# Patient Record
Sex: Male | Born: 1970 | Race: White | Hispanic: No | Marital: Married | Smoking: Former smoker
Health system: Southern US, Community
[De-identification: ages and names within clinical notes are randomized; demographics above are authoritative.]

## PROBLEM LIST (undated history)

## (undated) DIAGNOSIS — F3289 Other specified depressive episodes: Secondary | ICD-10-CM

## (undated) DIAGNOSIS — Z21 Asymptomatic human immunodeficiency virus [HIV] infection status: Secondary | ICD-10-CM

## (undated) DIAGNOSIS — R079 Chest pain, unspecified: Secondary | ICD-10-CM

## (undated) DIAGNOSIS — G4700 Insomnia, unspecified: Secondary | ICD-10-CM

## (undated) DIAGNOSIS — C44519 Basal cell carcinoma of skin of other part of trunk: Secondary | ICD-10-CM

## (undated) DIAGNOSIS — F329 Major depressive disorder, single episode, unspecified: Secondary | ICD-10-CM

## (undated) DIAGNOSIS — B009 Herpesviral infection, unspecified: Secondary | ICD-10-CM

## (undated) DIAGNOSIS — B20 Human immunodeficiency virus [HIV] disease: Secondary | ICD-10-CM

## (undated) DIAGNOSIS — R369 Urethral discharge, unspecified: Secondary | ICD-10-CM

## (undated) DIAGNOSIS — S82892A Other fracture of left lower leg, initial encounter for closed fracture: Secondary | ICD-10-CM

## (undated) DIAGNOSIS — F419 Anxiety disorder, unspecified: Secondary | ICD-10-CM

## (undated) HISTORY — DX: Chest pain, unspecified: R07.9

## (undated) HISTORY — DX: Human immunodeficiency virus (HIV) disease: B20

## (undated) HISTORY — DX: Insomnia, unspecified: G47.00

## (undated) HISTORY — DX: Urethral discharge, unspecified: R36.9

## (undated) HISTORY — DX: Herpesviral infection, unspecified: B00.9

## (undated) HISTORY — DX: Anxiety disorder, unspecified: F41.9

## (undated) HISTORY — DX: Basal cell carcinoma of skin of other part of trunk: C44.519

## (undated) HISTORY — DX: Other specified depressive episodes: F32.89

## (undated) HISTORY — DX: Major depressive disorder, single episode, unspecified: F32.9

## (undated) HISTORY — DX: Asymptomatic human immunodeficiency virus (hiv) infection status: Z21

## (undated) HISTORY — DX: Other fracture of left lower leg, initial encounter for closed fracture: S82.892A

---

## 2009-02-08 LAB — CONVERTED CEMR LAB: HIV 1 RNA Quant: <50 copies/mL

## 2009-06-13 LAB — CONVERTED CEMR LAB
CD4 Count: 514 microliters
HIV 1 RNA Quant: <50 copies/mL
Hemoglobin: 15 g/dL
WBC: 7.4 10*3/uL

## 2010-09-17 ENCOUNTER — Ambulatory Visit: Payer: Self-pay | Admitting: Adult Health

## 2010-09-17 DIAGNOSIS — G4700 Insomnia, unspecified: Secondary | ICD-10-CM | POA: Insufficient documentation

## 2010-09-17 DIAGNOSIS — R369 Urethral discharge, unspecified: Secondary | ICD-10-CM | POA: Insufficient documentation

## 2010-09-17 DIAGNOSIS — B20 Human immunodeficiency virus [HIV] disease: Secondary | ICD-10-CM | POA: Insufficient documentation

## 2010-09-17 DIAGNOSIS — F32A Depression, unspecified: Secondary | ICD-10-CM | POA: Insufficient documentation

## 2010-09-17 DIAGNOSIS — F329 Major depressive disorder, single episode, unspecified: Secondary | ICD-10-CM

## 2010-09-17 DIAGNOSIS — B009 Herpesviral infection, unspecified: Secondary | ICD-10-CM | POA: Insufficient documentation

## 2010-09-17 LAB — CONVERTED CEMR LAB
Albumin: 5.3 g/dL — ABNORMAL HIGH (ref 3.5–5.2)
Alkaline Phosphatase: 52 units/L (ref 39–117)
BUN: 10 mg/dL (ref 6–23)
Basophils Relative: 0 % (ref 0–1)
CO2: 28 meq/L (ref 19–32)
Calcium: 9.6 mg/dL (ref 8.4–10.5)
Chloride: 102 meq/L (ref 96–112)
Glucose, Bld: 98 mg/dL (ref 70–99)
HCV Ab: NEGATIVE
HDL: 41 mg/dL (ref >39)
HIV-2 Ab: NEGATIVE
Hep A Total Ab: POSITIVE — AB
LDL Cholesterol: 114 mg/dL — ABNORMAL HIGH (ref 0–99)
Lymphocytes Relative: 35 % (ref 12–46)
MCHC: 34.1 g/dL (ref 30.0–36.0)
Monocytes Relative: 6 % (ref 3–12)
Neutro Abs: 2.9 10*3/uL (ref 1.7–7.7)
Neutrophils Relative %: 57 % (ref 43–77)
Potassium: 4.4 meq/L (ref 3.5–5.3)
RBC: 4.42 M/uL (ref 4.22–5.81)
Sodium: 140 meq/L (ref 135–145)
Total CHOL/HDL Ratio: 4.6
Total Protein: 7.9 g/dL (ref 6.0–8.3)
Triglycerides: 160 mg/dL — ABNORMAL HIGH (ref <150)
WBC: 5.1 10*3/uL (ref 4.0–10.5)

## 2010-10-02 ENCOUNTER — Ambulatory Visit: Payer: Self-pay | Admitting: Infectious Diseases

## 2010-10-02 DIAGNOSIS — F17200 Nicotine dependence, unspecified, uncomplicated: Secondary | ICD-10-CM | POA: Insufficient documentation

## 2010-11-15 ENCOUNTER — Encounter: Payer: Self-pay | Admitting: Infectious Diseases

## 2010-12-10 NOTE — Miscellaneous (Signed)
Summary: HIPAA Restrictions  HIPAA Restrictions   Imported By: Florinda Marker 10/08/2010 09:24:14  _____________________________________________________________________  External Attachment:    Type:   Image     Comment:   External Document
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
## 2010-12-10 NOTE — Assessment & Plan Note (Signed)
Summary: Nurse Visit (Infectious Disease)   Infectious Disease New Patient Intake Referring MD/Agency: [facility]  Address: [address] [place], [place] [address]  Return Appointment Date: 10/02/2010  With Physician: Johny Sax  Medical Records: Received [facility] / Payor: Medicare    Do you have a Primary physician: No  Medical History Medication Allergies: No   Tobacco Use: never  Behavioral Health Assessment Have you ever been diagnosed with depression or mental illness? Yes  Diagnosis: Depression, Anxiety  Do you drink alcohol? 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 1996 Was this your first time ever being tested or HIV? Yes Risk Factor(s) for HIV: Injecting Drug Use (IDU)  Method of Exposure to HIV: IV Drug Use Heterosexual Intercourse Have you ever been hospitalized for any HIV-related condition? No  Have you ever been under the care of a physician for being HIV positive? Yes Name of Physician 1: [facility].   City/State: [facility] [address]  Newly Diagnosed Patients Has a Disease Intervention Specialist from the Health Department contacted the patient? No.   The patient has been informed that the [facility] Department will contact ALL newly reported cases. Health Department Contact:  [phone]   (SSN is needed for confirmation)  Health Department Contact:  [phone]            (SSN is needed for confirmation)   Infection History  Patient has been diagnosed with the following opportunistic infections: Are there any other symptoms you need to discuss? No Have you received literature/education prior to this visit about HIV/AIDS? Yes Do you understand the meaning of a Viral Load? Yes Do  you understand the meaning of a CD4 count? Yes Lab Values Education/Handout Given Yes Medication Education/Handout Given Yes  Sexual History Are you in a current relationship? No Are you currently sexually active? No Safe Sex Counseling/Pamphlet Given Sexual History Comments: No sexual activity in one year   Immunizations Administered:  PPD Skin Test:    Vaccine Type: PPD    Site: left forearm    Mfr: Sanofi Pasteur    Dose: 0.1 ml    Route: ID    Given by: Tomasita Morrow RN    Exp. Date: 05/16/2012    Lot #: [id number]  Influenza Vaccine # 1:    Vaccine Type: Fluvax MCR    Site: right deltoid    Mfr: Novartis    Dose: 0.5 ml    Route: IM    Given by: Tomasita Morrow RN    Exp. Date: 03/09/2011    Lot #: [id number]    VIS given: 06/04/10 version given September 17, 2010.  Pneumonia Vaccine:    Vaccine Type: Pneumovax    Site: left deltoid    Mfr: Merck    Dose: 0.5 ml    Route: IM    Given by: Tomasita Morrow RN    Exp. Date: 03/11/2012    Lot #: [id number]    VIS given: 10/15/09 version given September 17, 2010.  Orders Added: 1)  T-Chlamydia  Probe, urine [id number] 2)  T-CBC w/Diff [[id number]] 3)  T-CD4SP [facility]) [CD4SP] 4)  T-GC Probe, urine [id number] 5)  T-Comprehensive Metabolic Panel [[id number]] 6)  T-Hepatitis B Surface Antigen [[id number]] 7)  T-Hepatitis B Surface Antibody [[id number]] 8)  T-Hepatitis B Core Antibody [86704-23570] 9)  T-Hepatitis C Antibody [86803-23620] 10)  T-Hepatitis A Antibody [[address]] 11)  T-HIV1 Quant rflx Ultra or Genotype [id number] 12)  T-HIV Ab Confirmatory Test/Western Blot [[id number]] 13)  T-Lipid Profile [80061-22930] 14)  T-RPR (Syphilis) [86592-23940] 15)  T-Urinalysis [81003-65000] 16)  T- GC Chlamydia [29562] 17)  TB Skin Test [86580] 18)  Admin 1st Vaccine [90471] 19)  Influenza Vaccine MCR [00025] 20)  Pneumococcal Vaccine [90732] 21)  Admin of Any Addtl Vaccine [90472]    -  Date:  06/13/2009    CD4:  514    Viral Load: <50    CD4%: 34    Hemoglobin: 15    WBC: 7.4    Creatinine: .99  Date:  02/08/2009    CD4: 689    Viral Load: <50

## 2010-12-10 NOTE — Consult Note (Signed)
Summary: New Pt. Referral:   New Pt. Referral:   Imported By: Florinda Marker 10/09/2010 16:32:23  _____________________________________________________________________  External Attachment:    Type:   Image     Comment:   External Document

## 2010-12-12 NOTE — Miscellaneous (Signed)
Summary: RW Update  Clinical Lists Changes  Observations: Added new observation of RWPARTICIP: Yes (11/15/2010 10:23)

## 2010-12-20 ENCOUNTER — Encounter (INDEPENDENT_AMBULATORY_CARE_PROVIDER_SITE_OTHER): Payer: Self-pay | Admitting: *Deleted

## 2010-12-25 ENCOUNTER — Encounter (INDEPENDENT_AMBULATORY_CARE_PROVIDER_SITE_OTHER): Payer: Self-pay | Admitting: *Deleted

## 2010-12-26 NOTE — Miscellaneous (Signed)
  Clinical Lists Changes  Observations: Added new observation of LATINO/HISP: No (12/20/2010 13:20) Added new observation of RACE: White (12/20/2010 13:20)

## 2011-01-01 NOTE — Miscellaneous (Signed)
Summary: RW update  Clinical Lists Changes  Observations: Added new observation of MARITAL STAT: Married (12/25/2010 15:12)

## 2011-01-27 ENCOUNTER — Other Ambulatory Visit: Payer: Self-pay | Admitting: Infectious Diseases

## 2011-01-27 ENCOUNTER — Other Ambulatory Visit (INDEPENDENT_AMBULATORY_CARE_PROVIDER_SITE_OTHER): Payer: Self-pay

## 2011-01-27 DIAGNOSIS — B20 Human immunodeficiency virus [HIV] disease: Secondary | ICD-10-CM

## 2011-01-28 LAB — T-HELPER CELL (CD4) - (RCID CLINIC ONLY): CD4 % Helper T Cell: 37 % (ref 33–55)

## 2011-01-29 LAB — HIV-1 RNA QUANT-NO REFLEX-BLD
HIV 1 RNA Quant: <20 copies/mL (ref <20)
HIV-1 RNA Quant, Log: <1.3 {Log} (ref <1.30)

## 2011-02-10 ENCOUNTER — Ambulatory Visit: Payer: Self-pay | Admitting: Infectious Diseases

## 2011-02-18 ENCOUNTER — Other Ambulatory Visit: Payer: Self-pay | Admitting: Infectious Diseases

## 2011-02-18 DIAGNOSIS — B20 Human immunodeficiency virus [HIV] disease: Secondary | ICD-10-CM

## 2011-02-18 MED ORDER — DARUNAVIR ETHANOLATE 400 MG PO TABS: 400.0000 mg | ORAL_TABLET | Freq: Every day | ORAL | Status: DC

## 2011-02-18 MED ORDER — ACYCLOVIR 800 MG PO TABS: 800.0000 mg | ORAL_TABLET | Freq: Every day | ORAL | Status: DC

## 2011-02-18 MED ORDER — EMTRICITABINE-TENOFOVIR DF 200-300 MG PO TABS: 1.0000 | ORAL_TABLET | Freq: Every day | ORAL | Status: DC

## 2011-02-18 MED ORDER — RITONAVIR 100 MG PO CAPS: 100.0000 mg | ORAL_CAPSULE | Freq: Every day | ORAL | Status: DC

## 2011-03-11 ENCOUNTER — Other Ambulatory Visit: Payer: Self-pay | Admitting: Licensed Clinical Social Worker

## 2011-03-11 DIAGNOSIS — B20 Human immunodeficiency virus [HIV] disease: Secondary | ICD-10-CM

## 2011-03-11 MED ORDER — RITONAVIR 100 MG PO TABS: 100.0000 mg | ORAL_TABLET | Freq: Every day | ORAL | Status: DC

## 2011-03-12 ENCOUNTER — Ambulatory Visit (INDEPENDENT_AMBULATORY_CARE_PROVIDER_SITE_OTHER): Payer: Self-pay | Admitting: Infectious Diseases

## 2011-03-12 ENCOUNTER — Encounter: Payer: Self-pay | Admitting: Infectious Diseases

## 2011-03-12 DIAGNOSIS — F17200 Nicotine dependence, unspecified, uncomplicated: Secondary | ICD-10-CM

## 2011-03-12 DIAGNOSIS — C44519 Basal cell carcinoma of skin of other part of trunk: Secondary | ICD-10-CM

## 2011-03-12 DIAGNOSIS — B20 Human immunodeficiency virus [HIV] disease: Secondary | ICD-10-CM

## 2011-03-12 NOTE — Assessment & Plan Note (Signed)
Doing well except for med lapse (after his labs so they do not reflect this). Will see him back in 4-5 months. His vax are uptodate. Offered condoms. Hep A Ab+.

## 2011-03-12 NOTE — Progress Notes (Signed)
  Subjective:    Patient ID: Gary Lindsey, male    DOB: January 02, 1971, 40 y.o.   MRN: [id number]  HPI 40 yo M with hx of HIV+ since 1996. States he got "sick" at that time. Had thrush, lymphadenopathy and CD4 42 and VL 3.5 million.  Has been on DRVr/TRV for ~ 88yrs. Was prev on KLT.  CD4 660 and VL <20, Chol and Trig <200 (01-27-11).  No problems, working. No problems with meds. Was off for a while in few days in April.   Review of Systems  Constitutional: Negative for appetite change and unexpected weight change.  Gastrointestinal: Negative for diarrhea and constipation.  Genitourinary: Negative for difficulty urinating.  Psychiatric/Behavioral: Positive for sleep disturbance.  works evenings so makes his sleep irregular.      Objective:   Physical Exam  Constitutional: He appears well-developed and well-nourished.  Eyes: EOM are normal. Pupils are equal, round, and reactive to light.  Neck: Neck supple.  Cardiovascular: Normal rate, regular rhythm and normal heart sounds.   Pulmonary/Chest: Effort normal and breath sounds normal.  Abdominal: Soft. Bowel sounds are normal. He exhibits no distension.  Skin:       Oh his L mid chest, posteriorly there is a ~1 cm pearly, raised, ulcerated area with scab. It has a small amt of d/c when pressed, then bleeds.           Assessment & Plan:

## 2011-03-12 NOTE — Assessment & Plan Note (Signed)
Will have him seen by Derm. Spoke with pt about this, risk for spread (low).

## 2011-03-12 NOTE — Assessment & Plan Note (Signed)
Cont  To be smoke free!

## 2011-04-23 ENCOUNTER — Other Ambulatory Visit: Payer: Self-pay | Admitting: Licensed Clinical Social Worker

## 2011-04-23 DIAGNOSIS — B20 Human immunodeficiency virus [HIV] disease: Secondary | ICD-10-CM

## 2011-04-23 MED ORDER — RITONAVIR 100 MG PO TABS: 100.0000 mg | ORAL_TABLET | Freq: Every day | ORAL | Status: DC

## 2011-05-13 ENCOUNTER — Other Ambulatory Visit: Payer: Self-pay | Admitting: Licensed Clinical Social Worker

## 2011-05-13 DIAGNOSIS — B20 Human immunodeficiency virus [HIV] disease: Secondary | ICD-10-CM

## 2011-05-13 MED ORDER — DARUNAVIR ETHANOLATE 400 MG PO TABS: 800.0000 mg | ORAL_TABLET | Freq: Every day | ORAL | Status: DC

## 2011-05-15 ENCOUNTER — Other Ambulatory Visit: Payer: Self-pay | Admitting: *Deleted

## 2011-05-15 DIAGNOSIS — B20 Human immunodeficiency virus [HIV] disease: Secondary | ICD-10-CM

## 2011-05-15 MED ORDER — DARUNAVIR ETHANOLATE 400 MG PO TABS: 800.0000 mg | ORAL_TABLET | Freq: Every day | ORAL | Status: DC

## 2011-08-27 ENCOUNTER — Other Ambulatory Visit: Payer: Self-pay | Admitting: Infectious Diseases

## 2011-08-27 ENCOUNTER — Other Ambulatory Visit (INDEPENDENT_AMBULATORY_CARE_PROVIDER_SITE_OTHER): Payer: Medicare Other

## 2011-08-27 ENCOUNTER — Other Ambulatory Visit: Payer: Self-pay

## 2011-08-27 DIAGNOSIS — B002 Herpesviral gingivostomatitis and pharyngotonsillitis: Secondary | ICD-10-CM

## 2011-08-27 DIAGNOSIS — B20 Human immunodeficiency virus [HIV] disease: Secondary | ICD-10-CM

## 2011-08-28 LAB — CBC WITH DIFFERENTIAL/PLATELET
Basophils Absolute: 0 10*3/uL (ref 0.0–0.1)
Eosinophils Relative: 1 % (ref 0–5)
Lymphocytes Relative: 22 % (ref 12–46)
Lymphs Abs: 1.6 10*3/uL (ref 0.7–4.0)
MCV: 97.1 fL (ref 78.0–100.0)
Neutrophils Relative %: 70 % (ref 43–77)
Platelets: 206 10*3/uL (ref 150–400)
RBC: 4.46 MIL/uL (ref 4.22–5.81)
RDW: 13.5 % (ref 11.5–15.5)
WBC: 7.1 10*3/uL (ref 4.0–10.5)

## 2011-08-28 LAB — COMPLETE METABOLIC PANEL WITH GFR
AST: 22 U/L (ref 0–37)
Alkaline Phosphatase: 60 U/L (ref 39–117)
BUN: 12 mg/dL (ref 6–23)
GFR, Est Non African American: >90 mL/min (ref >90)
Glucose, Bld: 79 mg/dL (ref 70–99)
Sodium: 140 mEq/L (ref 135–145)
Total Bilirubin: 0.3 mg/dL (ref 0.3–1.2)

## 2011-08-28 LAB — T-HELPER CELL (CD4) - (RCID CLINIC ONLY)
CD4 % Helper T Cell: 36 % (ref 33–55)
CD4 T Cell Abs: 570 uL (ref 400–2700)

## 2011-09-04 LAB — HIV-1 RNA QUANT-NO REFLEX-BLD: HIV 1 RNA Quant: 47 copies/mL — ABNORMAL HIGH (ref <20)

## 2011-09-10 ENCOUNTER — Encounter: Payer: Self-pay | Admitting: Infectious Diseases

## 2011-09-10 ENCOUNTER — Ambulatory Visit (INDEPENDENT_AMBULATORY_CARE_PROVIDER_SITE_OTHER): Payer: Medicaid Other | Admitting: Infectious Diseases

## 2011-09-10 DIAGNOSIS — Z113 Encounter for screening for infections with a predominantly sexual mode of transmission: Secondary | ICD-10-CM

## 2011-09-10 DIAGNOSIS — Z79899 Other long term (current) drug therapy: Secondary | ICD-10-CM

## 2011-09-10 DIAGNOSIS — Z23 Encounter for immunization: Secondary | ICD-10-CM

## 2011-09-10 DIAGNOSIS — C44519 Basal cell carcinoma of skin of other part of trunk: Secondary | ICD-10-CM

## 2011-09-10 DIAGNOSIS — B20 Human immunodeficiency virus [HIV] disease: Secondary | ICD-10-CM

## 2011-09-10 NOTE — Assessment & Plan Note (Addendum)
He is doing very well, gets flu shot today. Will cont his current rx's. Offered condoms. Will see him back in 6 months with labs prior.

## 2011-09-10 NOTE — Progress Notes (Signed)
  Subjective:    Patient ID: Darrow Barreiro, male    DOB: 05-06-1971, 40 y.o.   MRN: [id number]  HPI 40 yo M with hx of HIV+ since 1996. States he got "sick" at that time. Had thrush, lymphadenopathy and CD4 42 and VL 3.5 million.  Has been on DRVr/TRV for ~ 40yrs. Was prev on KLT.  CD4 570 and VL 47 (08-27-11).  Feels well, going to school. Sleeping better, working less.     Review of Systems  Constitutional: Negative for appetite change and unexpected weight change.  Gastrointestinal: Negative for diarrhea and constipation.  Genitourinary: Negative for dysuria.       Objective:   Physical Exam  Constitutional: He appears well-developed and well-nourished.  Eyes: EOM are normal. Pupils are equal, round, and reactive to light.  Neck: Neck supple.  Cardiovascular: Normal rate, regular rhythm and normal heart sounds.   Pulmonary/Chest: Effort normal and breath sounds normal.  Abdominal: Soft. Bowel sounds are normal. There is no tenderness.  Lymphadenopathy:    He has no cervical adenopathy.  Skin:             Assessment & Plan:

## 2011-09-10 NOTE — Progress Notes (Signed)
Addended by: Wendall Mola A on: 09/10/2011 03:48 PM   Modules accepted: Orders

## 2011-09-10 NOTE — Assessment & Plan Note (Signed)
Will send him to derm, states he has insurance now.

## 2011-09-17 ENCOUNTER — Other Ambulatory Visit: Payer: Self-pay | Admitting: *Deleted

## 2011-09-17 DIAGNOSIS — B20 Human immunodeficiency virus [HIV] disease: Secondary | ICD-10-CM

## 2011-09-17 MED ORDER — ACYCLOVIR 800 MG PO TABS: 800.0000 mg | ORAL_TABLET | Freq: Every day | ORAL | Status: DC

## 2011-09-17 MED ORDER — EMTRICITABINE-TENOFOVIR DF 200-300 MG PO TABS: 1.0000 | ORAL_TABLET | Freq: Every day | ORAL | Status: DC

## 2011-10-16 ENCOUNTER — Other Ambulatory Visit: Payer: Self-pay | Admitting: *Deleted

## 2011-10-16 DIAGNOSIS — B20 Human immunodeficiency virus [HIV] disease: Secondary | ICD-10-CM

## 2011-10-16 MED ORDER — RITONAVIR 100 MG PO TABS: 100.0000 mg | ORAL_TABLET | Freq: Every day | ORAL | Status: DC

## 2011-10-17 ENCOUNTER — Other Ambulatory Visit: Payer: Self-pay | Admitting: *Deleted

## 2011-10-28 ENCOUNTER — Telehealth: Payer: Self-pay | Admitting: *Deleted

## 2011-10-28 NOTE — Telephone Encounter (Signed)
Was unable to make referral to dermatology due to patient having [place] and referral needing to come from PCP.  He was assigned a PCP but never actually saw anyone.  Spoke with patient and he did schedule an appointment with a PCP and got his Medicaid changed to that provider. Called patient and left him a message to call back and let me know if he was ever able to be seen by a dermatologist. Wendall Mola CMA

## 2011-11-25 ENCOUNTER — Other Ambulatory Visit: Payer: Self-pay | Admitting: *Deleted

## 2011-11-25 DIAGNOSIS — B20 Human immunodeficiency virus [HIV] disease: Secondary | ICD-10-CM

## 2011-11-25 MED ORDER — EMTRICITABINE-TENOFOVIR DF 200-300 MG PO TABS: 1.0000 | ORAL_TABLET | Freq: Every day | ORAL | Status: DC

## 2011-11-25 MED ORDER — RITONAVIR 100 MG PO CAPS: 100.0000 mg | ORAL_CAPSULE | Freq: Every day | ORAL | Status: DC

## 2011-11-25 MED ORDER — DARUNAVIR ETHANOLATE 400 MG PO TABS: 800.0000 mg | ORAL_TABLET | Freq: Every day | ORAL | Status: DC

## 2011-12-04 ENCOUNTER — Telehealth: Payer: Self-pay | Admitting: *Deleted

## 2011-12-04 NOTE — Telephone Encounter (Signed)
Patient saw Dr. Bruna Potter as new PCP, he did not have a good experience there.  He is going to try [facility] and have his [place] changed to that office.  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  He has thought about going to the ED to see if they will refer him.  He is going to see how quickly he can get in to see a new PCP.  This clinic was unable to refer because of the [place]. Wendall Mola CMA

## 2011-12-11 ENCOUNTER — Telehealth: Payer: Self-pay | Admitting: *Deleted

## 2011-12-11 NOTE — Telephone Encounter (Signed)
error 

## 2011-12-29 ENCOUNTER — Other Ambulatory Visit: Payer: Self-pay | Admitting: *Deleted

## 2011-12-29 MED ORDER — DARUNAVIR ETHANOLATE 800 MG PO TABS: 800.0000 mg | ORAL_TABLET | Freq: Every day | ORAL | Status: DC

## 2011-12-30 ENCOUNTER — Other Ambulatory Visit: Payer: Medicaid Other

## 2011-12-30 ENCOUNTER — Telehealth: Payer: Self-pay | Admitting: *Deleted

## 2011-12-30 DIAGNOSIS — Z79899 Other long term (current) drug therapy: Secondary | ICD-10-CM

## 2011-12-30 DIAGNOSIS — Z113 Encounter for screening for infections with a predominantly sexual mode of transmission: Secondary | ICD-10-CM

## 2011-12-30 DIAGNOSIS — B20 Human immunodeficiency virus [HIV] disease: Secondary | ICD-10-CM

## 2011-12-30 NOTE — Telephone Encounter (Signed)
Patient came in for labs and said that he was seen at dermatology and the mole on his back was removed. The cancer was localized and no further treatment is needed. Wendall Mola CMA

## 2011-12-31 LAB — COMPREHENSIVE METABOLIC PANEL
Alkaline Phosphatase: 73 U/L (ref 39–117)
BUN: 10 mg/dL (ref 6–23)
Creat: 0.91 mg/dL (ref 0.50–1.35)
Glucose, Bld: 84 mg/dL (ref 70–99)
Sodium: 140 mEq/L (ref 135–145)
Total Bilirubin: 0.3 mg/dL (ref 0.3–1.2)

## 2011-12-31 LAB — CBC
Hemoglobin: 14.1 g/dL (ref 13.0–17.0)
MCHC: 32.5 g/dL (ref 30.0–36.0)
RBC: 4.4 MIL/uL (ref 4.22–5.81)
WBC: 6 10*3/uL (ref 4.0–10.5)

## 2011-12-31 LAB — LIPID PANEL
HDL: 36 mg/dL — ABNORMAL LOW (ref >39)
LDL Cholesterol: 91 mg/dL (ref 0–99)
Total CHOL/HDL Ratio: 4.6 Ratio
Triglycerides: 193 mg/dL — ABNORMAL HIGH (ref <150)
VLDL: 39 mg/dL (ref 0–40)

## 2012-01-01 LAB — HIV-1 RNA QUANT-NO REFLEX-BLD
HIV 1 RNA Quant: <20 copies/mL (ref <20)
HIV-1 RNA Quant, Log: <1.3 {Log} (ref <1.30)

## 2012-01-13 ENCOUNTER — Ambulatory Visit: Payer: Medicaid Other | Admitting: Infectious Diseases

## 2012-01-13 ENCOUNTER — Telehealth: Payer: Self-pay | Admitting: *Deleted

## 2012-01-13 NOTE — Telephone Encounter (Signed)
Called patient and got his voice mail box. Left a message to call and reschedule his missed appointment as soon as he can.

## 2012-02-21 ENCOUNTER — Other Ambulatory Visit: Payer: Self-pay | Admitting: Infectious Diseases

## 2012-04-03 ENCOUNTER — Other Ambulatory Visit: Payer: Self-pay | Admitting: Infectious Diseases

## 2012-04-30 ENCOUNTER — Other Ambulatory Visit: Payer: Self-pay | Admitting: Infectious Diseases

## 2012-05-03 ENCOUNTER — Other Ambulatory Visit (HOSPITAL_COMMUNITY)
Admission: RE | Admit: 2012-05-03 | Discharge: 2012-05-03 | Disposition: A | Discharge status: Home / Self Care | Payer: Medicare Other | Source: Ambulatory Visit | Attending: Infectious Diseases | Admitting: Infectious Diseases

## 2012-05-03 ENCOUNTER — Other Ambulatory Visit: Payer: Self-pay | Admitting: *Deleted

## 2012-05-03 ENCOUNTER — Other Ambulatory Visit: Payer: Medicare Other

## 2012-05-03 DIAGNOSIS — Z113 Encounter for screening for infections with a predominantly sexual mode of transmission: Secondary | ICD-10-CM | POA: Insufficient documentation

## 2012-05-03 DIAGNOSIS — B20 Human immunodeficiency virus [HIV] disease: Secondary | ICD-10-CM

## 2012-05-03 LAB — CBC WITH DIFFERENTIAL/PLATELET
Eosinophils Absolute: 0.1 10*3/uL (ref 0.0–0.7)
Eosinophils Relative: 1 % (ref 0–5)
Lymphs Abs: 1.4 10*3/uL (ref 0.7–4.0)
MCH: 32.1 pg (ref 26.0–34.0)
MCV: 94.7 fL (ref 78.0–100.0)
Platelets: 250 10*3/uL (ref 150–400)
RBC: 4.52 MIL/uL (ref 4.22–5.81)

## 2012-05-03 LAB — COMPLETE METABOLIC PANEL WITH GFR
BUN: 16 mg/dL (ref 6–23)
CO2: 31 mEq/L (ref 19–32)
Creat: 1.11 mg/dL (ref 0.50–1.35)
GFR, Est African American: >89 mL/min
GFR, Est Non African American: 83 mL/min
Glucose, Bld: 81 mg/dL (ref 70–99)
Total Bilirubin: 0.5 mg/dL (ref 0.3–1.2)

## 2012-05-04 LAB — T-HELPER CELL (CD4) - (RCID CLINIC ONLY)
CD4 % Helper T Cell: 36 % (ref 33–55)
CD4 T Cell Abs: 570 uL (ref 400–2700)

## 2012-05-11 ENCOUNTER — Telehealth: Payer: Self-pay | Admitting: *Deleted

## 2012-05-11 NOTE — Telephone Encounter (Signed)
Patient coming for appt.

## 2012-05-17 ENCOUNTER — Encounter: Payer: Self-pay | Admitting: Infectious Diseases

## 2012-05-17 ENCOUNTER — Ambulatory Visit (INDEPENDENT_AMBULATORY_CARE_PROVIDER_SITE_OTHER): Payer: Medicare Other | Admitting: Infectious Diseases

## 2012-05-17 VITALS — BP 128/84 | HR 65 | Temp 97.8°F | Ht 68.0 in | Wt 178.0 lb

## 2012-05-17 DIAGNOSIS — F411 Generalized anxiety disorder: Secondary | ICD-10-CM

## 2012-05-17 DIAGNOSIS — F419 Anxiety disorder, unspecified: Secondary | ICD-10-CM

## 2012-05-17 DIAGNOSIS — B20 Human immunodeficiency virus [HIV] disease: Secondary | ICD-10-CM

## 2012-05-17 DIAGNOSIS — R079 Chest pain, unspecified: Secondary | ICD-10-CM

## 2012-05-17 NOTE — Assessment & Plan Note (Signed)
Does not have with exertion.  He does not have sx consistent with cardiac origin.  Will schedule GXT to be thorough.

## 2012-05-17 NOTE — Assessment & Plan Note (Signed)
Offered to start him on SSRI (doesn't want, benzodiazepine (afraid he will get addicted), get him in to see counselor (doesn't want). I asked him to call us if there is anything that he needs. He is considering counseling. This is most likely cause of his CP.

## 2012-05-17 NOTE — Assessment & Plan Note (Signed)
He is doing well. Will see him back in 4 months for f/u with labs prior.
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
## 2012-05-30 ENCOUNTER — Other Ambulatory Visit: Payer: Self-pay | Admitting: Infectious Diseases

## 2012-06-09 ENCOUNTER — Encounter: Payer: Self-pay | Admitting: *Deleted

## 2012-06-10 ENCOUNTER — Ambulatory Visit: Payer: Medicare Other | Admitting: Cardiovascular Disease

## 2012-06-28 ENCOUNTER — Other Ambulatory Visit: Payer: Self-pay | Admitting: Infectious Diseases

## 2012-07-01 ENCOUNTER — Telehealth: Payer: Self-pay | Admitting: *Deleted

## 2012-07-01 NOTE — Telephone Encounter (Signed)
Patient called c/o mouth abcess.  He had been set up with a PCP and saw Dr. Bruna Potter one time, but he had a bad experience and has not gone back.  Given appt with Dr. Orvan Falconer, but he needs to establish with another PCP.  He just recently mailed in the form to be put on the Dental list. Wendall Mola CMA

## 2012-07-05 ENCOUNTER — Ambulatory Visit: Payer: Medicare Other | Admitting: Internal Medicine

## 2012-07-07 ENCOUNTER — Telehealth: Payer: Self-pay | Admitting: Infectious Diseases

## 2012-07-07 NOTE — Telephone Encounter (Signed)
Called pt- he is still having CP. He did not make his appt for GXT. He will reschedule. Pain is present on L, at rest, no pain when he is active.  Was having pain in mouth recently from probable dental infection. Took neighbors amoxil recently and feels better.   Encouraged pt get GXT.

## 2012-08-30 ENCOUNTER — Other Ambulatory Visit: Payer: Self-pay | Admitting: Infectious Diseases

## 2012-09-23 ENCOUNTER — Other Ambulatory Visit: Payer: Self-pay | Admitting: Infectious Diseases

## 2012-09-24 ENCOUNTER — Other Ambulatory Visit: Payer: Self-pay | Admitting: *Deleted

## 2012-09-24 DIAGNOSIS — B20 Human immunodeficiency virus [HIV] disease: Secondary | ICD-10-CM

## 2012-09-24 MED ORDER — EMTRICITABINE-TENOFOVIR DF 200-300 MG PO TABS: 1.0000 | ORAL_TABLET | Freq: Every day | ORAL | Status: DC

## 2012-09-24 MED ORDER — DARUNAVIR ETHANOLATE 800 MG PO TABS: 800.0000 mg | ORAL_TABLET | Freq: Every day | ORAL | Status: DC

## 2012-09-24 MED ORDER — RITONAVIR 100 MG PO TABS: 100.0000 mg | ORAL_TABLET | Freq: Every day | ORAL | Status: DC

## 2012-09-27 ENCOUNTER — Other Ambulatory Visit: Payer: Medicare Other

## 2012-09-27 DIAGNOSIS — B20 Human immunodeficiency virus [HIV] disease: Secondary | ICD-10-CM

## 2012-09-27 LAB — CBC
HCT: 43.5 % (ref 39.0–52.0)
Hemoglobin: 14.6 g/dL (ref 13.0–17.0)
MCH: 31.5 pg (ref 26.0–34.0)
MCHC: 33.6 g/dL (ref 30.0–36.0)
MCV: 93.8 fL (ref 78.0–100.0)

## 2012-09-27 LAB — COMPLETE METABOLIC PANEL WITH GFR
Albumin: 4.6 g/dL (ref 3.5–5.2)
Alkaline Phosphatase: 58 U/L (ref 39–117)
BUN: 11 mg/dL (ref 6–23)
GFR, Est Non African American: >89 mL/min
Glucose, Bld: 107 mg/dL — ABNORMAL HIGH (ref 70–99)
Potassium: 4.2 mEq/L (ref 3.5–5.3)
Total Bilirubin: 0.5 mg/dL (ref 0.3–1.2)

## 2012-09-28 LAB — T-HELPER CELL (CD4) - (RCID CLINIC ONLY): CD4 T Cell Abs: 650 uL (ref 400–2700)

## 2012-10-21 ENCOUNTER — Other Ambulatory Visit: Payer: Self-pay | Admitting: Infectious Diseases

## 2012-10-22 ENCOUNTER — Other Ambulatory Visit: Payer: Self-pay | Admitting: Infectious Diseases

## 2012-10-22 DIAGNOSIS — B20 Human immunodeficiency virus [HIV] disease: Secondary | ICD-10-CM

## 2012-10-25 ENCOUNTER — Ambulatory Visit: Payer: Medicare Other | Admitting: Infectious Diseases

## 2012-10-25 ENCOUNTER — Telehealth: Payer: Self-pay | Admitting: *Deleted

## 2012-10-25 NOTE — Telephone Encounter (Signed)
Generic voicemail left reminding patient that he missed today's appointment and asking him to call to reschedule at his earliest convenience. Andree Coss, RN

## 2012-10-28 ENCOUNTER — Other Ambulatory Visit (HOSPITAL_COMMUNITY)
Admission: RE | Admit: 2012-10-28 | Discharge: 2012-10-28 | Disposition: A | Discharge status: Home / Self Care | Payer: Medicare Other | Source: Ambulatory Visit | Attending: Infectious Diseases | Admitting: Infectious Diseases

## 2012-10-28 ENCOUNTER — Encounter: Payer: Self-pay | Admitting: Infectious Diseases

## 2012-10-28 ENCOUNTER — Ambulatory Visit (INDEPENDENT_AMBULATORY_CARE_PROVIDER_SITE_OTHER): Payer: Medicare Other | Admitting: Infectious Diseases

## 2012-10-28 VITALS — BP 135/82 | HR 69 | Temp 98.3°F | Ht 68.0 in | Wt 183.8 lb

## 2012-10-28 DIAGNOSIS — Z113 Encounter for screening for infections with a predominantly sexual mode of transmission: Secondary | ICD-10-CM

## 2012-10-28 DIAGNOSIS — Z79899 Other long term (current) drug therapy: Secondary | ICD-10-CM

## 2012-10-28 DIAGNOSIS — F329 Major depressive disorder, single episode, unspecified: Secondary | ICD-10-CM

## 2012-10-28 DIAGNOSIS — F3289 Other specified depressive episodes: Secondary | ICD-10-CM

## 2012-10-28 DIAGNOSIS — B078 Other viral warts: Secondary | ICD-10-CM | POA: Insufficient documentation

## 2012-10-28 DIAGNOSIS — A630 Anogenital (venereal) warts: Secondary | ICD-10-CM

## 2012-10-28 DIAGNOSIS — B20 Human immunodeficiency virus [HIV] disease: Secondary | ICD-10-CM

## 2012-10-28 DIAGNOSIS — Z23 Encounter for immunization: Secondary | ICD-10-CM

## 2012-10-28 NOTE — Assessment & Plan Note (Signed)
Few seen on exam. States he has had for years after relationship with F with vaginal warts. Anal pap sent.
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
## 2012-10-28 NOTE — Assessment & Plan Note (Signed)
He is doing well. Need to talk to him about partner status/testing. He is offered condoms, refuses. vax are up to date. Will see him back in 6 months.

## 2012-10-28 NOTE — Assessment & Plan Note (Signed)
His mood is much improved. Will continue to watch, he has had f/u with Bernette Redbird.

## 2012-11-17 ENCOUNTER — Other Ambulatory Visit: Payer: Self-pay | Admitting: Infectious Diseases

## 2013-02-03 ENCOUNTER — Other Ambulatory Visit: Payer: Self-pay | Admitting: Infectious Diseases

## 2013-03-07 ENCOUNTER — Other Ambulatory Visit: Payer: Self-pay | Admitting: *Deleted

## 2013-03-07 DIAGNOSIS — B009 Herpesviral infection, unspecified: Secondary | ICD-10-CM

## 2013-03-07 MED ORDER — ACYCLOVIR 800 MG PO TABS: ORAL_TABLET | ORAL | Status: DC

## 2013-04-25 ENCOUNTER — Other Ambulatory Visit: Payer: Medicare Other

## 2013-04-25 DIAGNOSIS — Z113 Encounter for screening for infections with a predominantly sexual mode of transmission: Secondary | ICD-10-CM

## 2013-04-25 DIAGNOSIS — Z79899 Other long term (current) drug therapy: Secondary | ICD-10-CM

## 2013-04-25 DIAGNOSIS — B20 Human immunodeficiency virus [HIV] disease: Secondary | ICD-10-CM

## 2013-04-25 LAB — COMPREHENSIVE METABOLIC PANEL
Albumin: 4.5 g/dL (ref 3.5–5.2)
BUN: 19 mg/dL (ref 6–23)
Calcium: 9.5 mg/dL (ref 8.4–10.5)
Chloride: 104 mEq/L (ref 96–112)
Creat: 1.28 mg/dL (ref 0.50–1.35)
Glucose, Bld: 107 mg/dL — ABNORMAL HIGH (ref 70–99)
Potassium: 5.1 mEq/L (ref 3.5–5.3)

## 2013-04-25 LAB — LIPID PANEL
Cholesterol: 163 mg/dL (ref 0–200)
HDL: 36 mg/dL — ABNORMAL LOW (ref >39)
Total CHOL/HDL Ratio: 4.5 Ratio
Triglycerides: 95 mg/dL (ref <150)

## 2013-04-25 LAB — CBC
MCH: 32.1 pg (ref 26.0–34.0)
MCHC: 34.1 g/dL (ref 30.0–36.0)
MCV: 94.3 fL (ref 78.0–100.0)
Platelets: 219 10*3/uL (ref 150–400)

## 2013-04-26 LAB — HIV-1 RNA QUANT-NO REFLEX-BLD: HIV 1 RNA Quant: <20 copies/mL (ref <20)

## 2013-04-26 LAB — RPR

## 2013-05-09 ENCOUNTER — Ambulatory Visit: Payer: Medicare Other | Admitting: Infectious Diseases

## 2013-05-11 ENCOUNTER — Ambulatory Visit: Payer: Medicare Other | Admitting: Infectious Diseases

## 2013-05-16 ENCOUNTER — Encounter: Payer: Self-pay | Admitting: Infectious Diseases

## 2013-05-16 ENCOUNTER — Ambulatory Visit (INDEPENDENT_AMBULATORY_CARE_PROVIDER_SITE_OTHER): Payer: Medicare Other | Admitting: Infectious Diseases

## 2013-05-16 VITALS — BP 148/82 | HR 73 | Temp 98.0°F | Ht 68.0 in | Wt 185.0 lb

## 2013-05-16 DIAGNOSIS — Z79899 Other long term (current) drug therapy: Secondary | ICD-10-CM

## 2013-05-16 DIAGNOSIS — G4700 Insomnia, unspecified: Secondary | ICD-10-CM

## 2013-05-16 DIAGNOSIS — B20 Human immunodeficiency virus [HIV] disease: Secondary | ICD-10-CM

## 2013-05-16 DIAGNOSIS — Z113 Encounter for screening for infections with a predominantly sexual mode of transmission: Secondary | ICD-10-CM

## 2013-05-16 MED ORDER — TEMAZEPAM 15 MG PO CAPS: 15.0000 mg | ORAL_CAPSULE | Freq: Every evening | ORAL | Status: DC | PRN

## 2013-05-16 NOTE — Progress Notes (Signed)
  Subjective:    Patient ID: Gary Lindsey, male    DOB: 01/04/1971, 42 y.o.   MRN: [id number]  HPI 42 yo M with HIV+, taking DRVr/TRV (prev on KLT/TRV).  Had anal pap for warts 12-13 that was (-).  Has been exercising 5/week. Running 4 miles TID. CP has resolved mostly- has occas. Feels like it is "lung pain". Does not have pain with exertion (lifting wts or with running. Feels more at rest). Still having difficulty with sleep despite this. Difficulty falling asleep. Taking classes at [employer], occas misses due to this. Has quit smoking marijuana as well.   HIV 1 RNA Quant (copies/mL)  Date Value  04/25/2013 <20   09/27/2012 <20   05/03/2012 <20      CD4 T Cell Abs (cmm)  Date Value  04/25/2013 480   09/27/2012 650   05/03/2012 570    Has gotten married.   Review of Systems  Constitutional: Negative for appetite change and unexpected weight change.  Respiratory: Negative for chest tightness and shortness of breath.   Cardiovascular: Positive for chest pain.  Gastrointestinal: Negative for diarrhea and constipation.  Genitourinary: Negative for difficulty urinating.  Psychiatric/Behavioral: Positive for sleep disturbance.       Objective:   Physical Exam  Constitutional: He appears well-developed and well-nourished.  HENT:  Mouth/Throat: No oropharyngeal exudate.  Eyes: EOM are normal. Pupils are equal, round, and reactive to light.  Neck: Neck supple.  Cardiovascular: Normal rate, regular rhythm and normal heart sounds.   Pulmonary/Chest: Effort normal and breath sounds normal.  Abdominal: Soft. Bowel sounds are normal. There is no tenderness.  Lymphadenopathy:    He has no cervical adenopathy.          Assessment & Plan:

## 2013-05-16 NOTE — Assessment & Plan Note (Signed)
Will give him rx for restoril, previously took valium and made him too sleepy.

## 2013-05-16 NOTE — Assessment & Plan Note (Signed)
He is doing very well. He is married and asks about having kids. I suggested that the risk to his wife (and child) of unprotected sex is very low but not zero. They should use condoms until time of ovulation. He asks about PrEP and a rx for truvada is written for his wife (he states that she just had normal labs). He is offered condoms (has plenty already). Will see him back in 6 months.

## 2013-05-17 ENCOUNTER — Telehealth: Payer: Self-pay | Admitting: Licensed Clinical Social Worker

## 2013-05-17 NOTE — Telephone Encounter (Signed)
Patient called stating that he tried  Restoril last night  and it did not help, he stayed up all night and was very groggy this morning. Would like another sleep aid prescription. He states he tried otc sleep aid with no relief.

## 2013-05-18 ENCOUNTER — Other Ambulatory Visit: Payer: Self-pay | Admitting: Infectious Diseases

## 2013-05-18 DIAGNOSIS — G4700 Insomnia, unspecified: Secondary | ICD-10-CM

## 2013-05-18 MED ORDER — ZOLPIDEM TARTRATE 10 MG PO TABS
10.0000 mg | ORAL_TABLET | Freq: Every evening | ORAL | Status: DC | PRN
Start: 2013-05-18 — End: 2014-02-22

## 2013-05-18 NOTE — Telephone Encounter (Signed)
rx sent for [employer], thanks

## 2013-05-18 NOTE — Telephone Encounter (Signed)
Thank you :)

## 2013-05-20 ENCOUNTER — Other Ambulatory Visit: Payer: Self-pay | Admitting: *Deleted

## 2013-05-20 DIAGNOSIS — B009 Herpesviral infection, unspecified: Secondary | ICD-10-CM

## 2013-05-20 MED ORDER — ACYCLOVIR 800 MG PO TABS: ORAL_TABLET | ORAL | Status: DC

## 2013-08-10 ENCOUNTER — Other Ambulatory Visit: Payer: Self-pay | Admitting: Infectious Diseases

## 2013-08-10 DIAGNOSIS — A630 Anogenital (venereal) warts: Secondary | ICD-10-CM

## 2013-09-29 ENCOUNTER — Other Ambulatory Visit: Payer: Self-pay | Admitting: Licensed Clinical Social Worker

## 2013-09-29 DIAGNOSIS — B20 Human immunodeficiency virus [HIV] disease: Secondary | ICD-10-CM

## 2013-09-29 MED ORDER — EMTRICITABINE-TENOFOVIR DF 200-300 MG PO TABS: 1.0000 | ORAL_TABLET | Freq: Every day | ORAL | Status: DC

## 2013-09-29 MED ORDER — DARUNAVIR ETHANOLATE 800 MG PO TABS: 800.0000 mg | ORAL_TABLET | Freq: Every day | ORAL | Status: DC

## 2013-09-29 MED ORDER — RITONAVIR 100 MG PO TABS: 100.0000 mg | ORAL_TABLET | Freq: Every day | ORAL | Status: DC

## 2013-10-28 ENCOUNTER — Ambulatory Visit (INDEPENDENT_AMBULATORY_CARE_PROVIDER_SITE_OTHER): Payer: Medicare Other | Admitting: *Deleted

## 2013-10-28 DIAGNOSIS — Z23 Encounter for immunization: Secondary | ICD-10-CM

## 2013-12-12 ENCOUNTER — Other Ambulatory Visit: Payer: Self-pay | Admitting: Infectious Diseases

## 2014-01-06 ENCOUNTER — Other Ambulatory Visit: Payer: Self-pay | Admitting: Infectious Diseases

## 2014-02-03 ENCOUNTER — Other Ambulatory Visit: Payer: Self-pay | Admitting: Infectious Diseases

## 2014-02-05 ENCOUNTER — Other Ambulatory Visit: Payer: Self-pay | Admitting: Infectious Diseases

## 2014-02-08 ENCOUNTER — Other Ambulatory Visit (INDEPENDENT_AMBULATORY_CARE_PROVIDER_SITE_OTHER): Payer: Medicare Other

## 2014-02-08 DIAGNOSIS — B20 Human immunodeficiency virus [HIV] disease: Secondary | ICD-10-CM

## 2014-02-08 DIAGNOSIS — Z113 Encounter for screening for infections with a predominantly sexual mode of transmission: Secondary | ICD-10-CM

## 2014-02-08 DIAGNOSIS — Z79899 Other long term (current) drug therapy: Secondary | ICD-10-CM

## 2014-02-08 LAB — LIPID PANEL
CHOLESTEROL: 170 mg/dL (ref 0–200)
HDL: 41 mg/dL (ref >39)
LDL Cholesterol: 117 mg/dL — ABNORMAL HIGH (ref 0–99)
Total CHOL/HDL Ratio: 4.1 Ratio
Triglycerides: 59 mg/dL (ref <150)
VLDL: 12 mg/dL (ref 0–40)

## 2014-02-08 LAB — CBC
HCT: 43.9 % (ref 39.0–52.0)
Hemoglobin: 15.1 g/dL (ref 13.0–17.0)
MCH: 32 pg (ref 26.0–34.0)
MCHC: 34.4 g/dL (ref 30.0–36.0)
MCV: 93 fL (ref 78.0–100.0)
PLATELETS: 208 10*3/uL (ref 150–400)
RBC: 4.72 MIL/uL (ref 4.22–5.81)
RDW: 15.2 % (ref 11.5–15.5)
WBC: 5.4 10*3/uL (ref 4.0–10.5)

## 2014-02-08 LAB — COMPLETE METABOLIC PANEL WITH GFR
ALT: 22 U/L (ref 0–53)
AST: 30 U/L (ref 0–37)
Albumin: 4.7 g/dL (ref 3.5–5.2)
Alkaline Phosphatase: 55 U/L (ref 39–117)
BUN: 19 mg/dL (ref 6–23)
CO2: 27 meq/L (ref 19–32)
Calcium: 9.2 mg/dL (ref 8.4–10.5)
Chloride: 102 mEq/L (ref 96–112)
Creat: 1.13 mg/dL (ref 0.50–1.35)
GFR, EST NON AFRICAN AMERICAN: 80 mL/min
GLUCOSE: 107 mg/dL — AB (ref 70–99)
POTASSIUM: 4.4 meq/L (ref 3.5–5.3)
SODIUM: 139 meq/L (ref 135–145)
TOTAL PROTEIN: 7.5 g/dL (ref 6.0–8.3)
Total Bilirubin: 0.9 mg/dL (ref 0.2–1.2)

## 2014-02-09 LAB — RPR

## 2014-02-09 LAB — T-HELPER CELL (CD4) - (RCID CLINIC ONLY)
CD4 % Helper T Cell: 38 % (ref 33–55)
CD4 T Cell Abs: 650 /uL (ref 400–2700)

## 2014-02-09 LAB — HIV-1 RNA QUANT-NO REFLEX-BLD: HIV-1 RNA Quant, Log: <1.3 {Log} (ref <1.30)

## 2014-02-22 ENCOUNTER — Encounter: Payer: Self-pay | Admitting: Infectious Diseases

## 2014-02-22 ENCOUNTER — Ambulatory Visit (INDEPENDENT_AMBULATORY_CARE_PROVIDER_SITE_OTHER): Payer: Medicare Other | Admitting: Infectious Diseases

## 2014-02-22 VITALS — BP 142/83 | HR 73 | Temp 98.3°F | Ht 68.0 in | Wt 188.0 lb

## 2014-02-22 DIAGNOSIS — B20 Human immunodeficiency virus [HIV] disease: Secondary | ICD-10-CM

## 2014-02-22 DIAGNOSIS — G4700 Insomnia, unspecified: Secondary | ICD-10-CM

## 2014-02-22 DIAGNOSIS — A630 Anogenital (venereal) warts: Secondary | ICD-10-CM

## 2014-02-22 MED ORDER — DARUNAVIR ETHANOLATE 800 MG PO TABS: 800.0000 mg | ORAL_TABLET | Freq: Every day | ORAL | Status: DC

## 2014-02-22 MED ORDER — EMTRICITABINE-TENOFOVIR DF 200-300 MG PO TABS: 1.0000 | ORAL_TABLET | Freq: Every day | ORAL | Status: DC

## 2014-02-22 MED ORDER — RITONAVIR 100 MG PO TABS: 100.0000 mg | ORAL_TABLET | Freq: Every day | ORAL | Status: DC

## 2014-02-22 MED ORDER — ACYCLOVIR 800 MG PO TABS: ORAL_TABLET | ORAL | Status: DC

## 2014-02-22 NOTE — Assessment & Plan Note (Addendum)
He is doing very well. He has no concerns about his steroid use (250mg /week, does not share needles, has his own "safe box"), has done previously.  I counseled him about pregnancy planning- they will start trying in fall. His wife will have f/u at [facility] prior, will get HIV tested. I instructed him on rhythm, using condoms except for periods around ovulation. I offered PREP for her.  He will rtc in 6 months, labs prior. Offered/refused condoms.  Hep B immune.

## 2014-02-22 NOTE — Progress Notes (Signed)
   Subjective:    Patient ID: Gary Lindsey, male    DOB: 1971/06/28, 43 y.o.   MRN: [id number]  HPI 43 yo M with HIV+, taking DRVr/TRV (prev on KLT/TRV).  Had anal pap for warts 12-13 that was (-).  Has been working out at gym, taking steroids there.  trying to have baby with wife.  HIV 1 RNA Quant (copies/mL)  Date Value  02/08/2014 <20   04/25/2013 <20   09/27/2012 <20      CD4 T Cell Abs (/uL)  Date Value  02/08/2014 650   04/25/2013 480   09/27/2012 650     Review of Systems  Constitutional: Negative for appetite change and unexpected weight change.  Gastrointestinal: Negative for diarrhea and constipation.  Genitourinary: Negative for difficulty urinating.  Psychiatric/Behavioral: Negative for sleep disturbance and dysphoric mood.   Has occas CP after smoking THC, none for 6 months.      Objective:   Physical Exam  Constitutional: He appears well-developed and well-nourished.  HENT:  Mouth/Throat: No oropharyngeal exudate.  Eyes: EOM are normal. Pupils are equal, round, and reactive to light.  Neck: Neck supple.  Cardiovascular: Normal rate, regular rhythm and normal heart sounds.   Pulmonary/Chest: Effort normal and breath sounds normal.  Abdominal: Soft. Bowel sounds are normal. He exhibits no distension. There is no tenderness.  Lymphadenopathy:    He has no cervical adenopathy.  Psychiatric: He has a normal mood and affect.          Assessment & Plan:

## 2014-02-22 NOTE — Assessment & Plan Note (Signed)
Will f/u at next visit, consider HRA.

## 2014-02-22 NOTE — Assessment & Plan Note (Signed)
Improved while on steroids. Will watch.Marland KitchenMarland Kitchen

## 2014-06-07 ENCOUNTER — Emergency Department (HOSPITAL_COMMUNITY)
Admission: EM | Admit: 2014-06-07 | Discharge: 2014-06-08 | Disposition: A | Discharge status: Home / Self Care | Payer: No Typology Code available for payment source | Attending: Emergency Medicine | Admitting: Emergency Medicine

## 2014-06-07 ENCOUNTER — Encounter (HOSPITAL_COMMUNITY): Payer: Self-pay | Admitting: Emergency Medicine

## 2014-06-07 ENCOUNTER — Emergency Department (HOSPITAL_COMMUNITY): Payer: No Typology Code available for payment source

## 2014-06-07 DIAGNOSIS — S0990XA Unspecified injury of head, initial encounter: Secondary | ICD-10-CM | POA: Insufficient documentation

## 2014-06-07 DIAGNOSIS — S82843A Displaced bimalleolar fracture of unspecified lower leg, initial encounter for closed fracture: Secondary | ICD-10-CM | POA: Insufficient documentation

## 2014-06-07 DIAGNOSIS — S8990XA Unspecified injury of unspecified lower leg, initial encounter: Secondary | ICD-10-CM | POA: Diagnosis present

## 2014-06-07 DIAGNOSIS — S99929A Unspecified injury of unspecified foot, initial encounter: Secondary | ICD-10-CM | POA: Diagnosis present

## 2014-06-07 DIAGNOSIS — Y9389 Activity, other specified: Secondary | ICD-10-CM | POA: Insufficient documentation

## 2014-06-07 DIAGNOSIS — Y92410 Unspecified street and highway as the place of occurrence of the external cause: Secondary | ICD-10-CM | POA: Diagnosis not present

## 2014-06-07 DIAGNOSIS — S82892A Other fracture of left lower leg, initial encounter for closed fracture: Secondary | ICD-10-CM

## 2014-06-07 DIAGNOSIS — S99919A Unspecified injury of unspecified ankle, initial encounter: Secondary | ICD-10-CM

## 2014-06-07 LAB — COMPREHENSIVE METABOLIC PANEL
ALT: 25 U/L (ref 0–53)
AST: 38 U/L — AB (ref 0–37)
Albumin: 3.9 g/dL (ref 3.5–5.2)
Alkaline Phosphatase: 52 U/L (ref 39–117)
Anion gap: 14 (ref 5–15)
BUN: 20 mg/dL (ref 6–23)
CALCIUM: 8.3 mg/dL — AB (ref 8.4–10.5)
CO2: 24 meq/L (ref 19–32)
Chloride: 102 mEq/L (ref 96–112)
Creatinine, Ser: 1.26 mg/dL (ref 0.50–1.35)
GFR calc Af Amer: 80 mL/min — ABNORMAL LOW (ref >90)
GFR calc non Af Amer: 69 mL/min — ABNORMAL LOW (ref >90)
Glucose, Bld: 92 mg/dL (ref 70–99)
Potassium: 4.2 mEq/L (ref 3.7–5.3)
Sodium: 140 mEq/L (ref 137–147)
TOTAL PROTEIN: 7.3 g/dL (ref 6.0–8.3)
Total Bilirubin: 0.2 mg/dL — ABNORMAL LOW (ref 0.3–1.2)

## 2014-06-07 LAB — PROTIME-INR
INR: 0.92 (ref 0.00–1.49)
PROTHROMBIN TIME: 12.4 s (ref 11.6–15.2)

## 2014-06-07 LAB — CBC
HCT: 45.4 % (ref 39.0–52.0)
Hemoglobin: 15.6 g/dL (ref 13.0–17.0)
MCH: 33.3 pg (ref 26.0–34.0)
MCHC: 34.4 g/dL (ref 30.0–36.0)
MCV: 96.8 fL (ref 78.0–100.0)
Platelets: 196 10*3/uL (ref 150–400)
RBC: 4.69 MIL/uL (ref 4.22–5.81)
RDW: 13 % (ref 11.5–15.5)
WBC: 8.5 10*3/uL (ref 4.0–10.5)

## 2014-06-07 LAB — SAMPLE TO BLOOD BANK

## 2014-06-07 LAB — I-STAT CG4 LACTIC ACID, ED: LACTIC ACID, VENOUS: 2.19 mmol/L (ref 0.5–2.2)

## 2014-06-07 LAB — ETHANOL: Alcohol, Ethyl (B): 67 mg/dL — ABNORMAL HIGH (ref 0–11)

## 2014-06-07 LAB — CDS SEROLOGY

## 2014-06-07 MED ORDER — HYDROMORPHONE HCL PF 1 MG/ML IJ SOLN
1.0000 mg | Freq: Once | INTRAMUSCULAR | Status: AC
  Administered 2014-06-07: 1 mg via INTRAVENOUS
  Filled 2014-06-07: qty 1

## 2014-06-07 MED ORDER — PROPOFOL 10 MG/ML IV BOLUS
0.5000 mg/kg | Freq: Once | INTRAVENOUS | Status: AC
  Administered 2014-06-07: 70 mg via INTRAVENOUS

## 2014-06-07 MED ORDER — PROPOFOL 10 MG/ML IV BOLUS
INTRAVENOUS | Status: AC
  Administered 2014-06-07: 70 mg via INTRAVENOUS
  Filled 2014-06-07: qty 20

## 2014-06-07 MED ORDER — SODIUM CHLORIDE 0.9 % IV BOLUS (SEPSIS)
1000.0000 mL | Freq: Once | INTRAVENOUS | Status: AC
  Administered 2014-06-07: 1000 mL via INTRAVENOUS

## 2014-06-07 MED ORDER — PROPOFOL 10 MG/ML IV BOLUS
INTRAVENOUS | Status: AC | PRN
  Administered 2014-06-07: 30 mg via INTRAVENOUS

## 2014-06-07 NOTE — Progress Notes (Signed)
Orthopedic Tech Progress Note Patient Details:  Gary Lindsey [birth year] [id number]  Ortho Devices Type of Ortho Device: Ace wrap;[place] (short leg) splint;Stirrup splint Ortho Device/Splint Location: LLE Ortho Device/Splint Interventions: Ordered;Application   Braulio Bosch 06/07/2014, 10:55 PM
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
## 2014-06-07 NOTE — ED Notes (Signed)
Family at bedside and updated to patient's status

## 2014-06-07 NOTE — ED Notes (Signed)
Pt taken to CT.

## 2014-06-07 NOTE — ED Notes (Signed)
Ortho tech paged for left ankle splint
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
## 2014-06-08 ENCOUNTER — Emergency Department (HOSPITAL_COMMUNITY): Payer: No Typology Code available for payment source

## 2014-06-08 ENCOUNTER — Encounter (HOSPITAL_COMMUNITY): Payer: Self-pay

## 2014-06-08 MED ORDER — HYDROMORPHONE HCL PF 1 MG/ML IJ SOLN
1.0000 mg | Freq: Once | INTRAMUSCULAR | Status: AC
  Administered 2014-06-08: 1 mg via INTRAVENOUS
  Filled 2014-06-08: qty 1

## 2014-06-08 MED ORDER — HYDROCODONE-ACETAMINOPHEN 5-325 MG PO TABS: 2.0000 | ORAL_TABLET | ORAL | Status: DC | PRN

## 2014-06-08 NOTE — ED Notes (Signed)
EDP spoke with ortho surgeon, pt will follow up outside of ED

## 2014-06-08 NOTE — Discharge Instructions (Signed)
Ankle Fracture  A fracture is a break in a bone. The ankle joint is made up of three bones. These include the lower (distal)sections of your lower leg bones, called the tibia and fibula, along with a bone in your foot, called the talus. Depending on how bad the break is and if more than one ankle joint bone is broken, a cast or splint is used to protect and keep your injured bone from moving while it heals. Sometimes, surgery is required to help the fracture heal properly.   There are two general types of fractures:   Stable fracture. This includes a single fracture line through one bone, with no injury to ankle ligaments. A fracture of the talus that does not have any displacement (movement of the bone on either side of the fracture line) is also stable.   Unstable fracture. This includes more than one fracture line through one or more bones in the ankle joint. It also includes fractures that have displacement of the bone on either side of the fracture line.  CAUSES   A direct blow to the ankle.    Quickly and severely twisting your ankle.   Trauma, such as a car accident or falling from a significant height.  RISK FACTORS  You may be at a higher risk of ankle fracture if:   You have certain medical conditions.   You are involved in high-impact sports.   You are involved in a high-impact car accident.  SIGNS AND SYMPTOMS    Tender and swollen ankle.   Bruising around the injured ankle.   Pain on movement of the ankle.   Difficulty walking or putting weight on the ankle.   A cold foot below the site of the ankle injury. This can occur if the blood vessels passing through your injured ankle were also damaged.   Numbness in the foot below the site of the ankle injury.  DIAGNOSIS   An ankle fracture is usually diagnosed with a physical exam and X-rays. A CT scan may also be required for complex fractures.  TREATMENT   Stable fractures are treated with a cast or splint and using crutches to avoid putting  weight on your injured ankle. This is followed by an ankle strengthening program. Some patients require a special type of cast, depending on other medical problems they may have. Unstable fractures require surgery to ensure the bones heal properly. Your health care provider will tell you what type of fracture you have and the best treatment for your condition.  HOME CARE INSTRUCTIONS    Review correct crutch use with your health care provider and use your crutches as directed. Safe use of crutches is extremely important. Misuse of crutches can cause you to fall or cause injury to nerves in your hands or armpits.   Do not put weight or pressure on the injured ankle until directed by your health care provider.   To lessen the swelling, keep the injured leg elevated while sitting or lying down.   Apply ice to the injured area:   Put ice in a plastic bag.   Place a towel between your cast and the bag.   Leave the ice on for 20 minutes, 2-3 times a day.   If you have a plaster or fiberglass cast:   Do not try to scratch the skin under the cast with any objects. This can increase your risk of skin infection.   Check the skin around the cast every day. You   may put lotion on any red or sore areas.   Keep your cast dry and clean.   If you have a plaster splint:   Wear the splint as directed.   You may loosen the elastic around the splint if your toes become numb, tingle, or turn cold or blue.   Do not put pressure on any part of your cast or splint; it may break. Rest your cast only on a pillow the first 24 hours until it is fully hardened.   Your cast or splint can be protected during bathing with a plastic bag sealed to your skin with medical tape. Do not lower the cast or splint into water.   Take medicines as directed by your health care provider. Only take over-the-counter or prescription medicines for pain, discomfort, or fever as directed by your health care provider.   Do not drive a vehicle until  your health care provider specifically tells you it is safe to do so.   If your health care provider has given you a follow-up appointment, it is very important to keep that appointment. Not keeping the appointment could result in a chronic or permanent injury, pain, and disability. If you have any problem keeping the appointment, call the facility for assistance.  SEEK MEDICAL CARE IF:  You develop increased swelling or discomfort.  SEEK IMMEDIATE MEDICAL CARE IF:    Your cast gets damaged or breaks.   You have continued severe pain.   You develop new pain or swelling after the cast was put on.   Your skin or toenails below the injury turn blue or gray.   Your skin or toenails below the injury feel cold, numb, or have loss of sensitivity to touch.   There is a bad smell or pus draining from under the cast.  MAKE SURE YOU:    Understand these instructions.   Will watch your condition.   Will get help right away if you are not doing well or get worse.  Document Released: 10/24/2000 Document Revised: 11/01/2013 Document Reviewed: 05/26/2013  Exit[employer] Patient Information 2015 [employer]. This information is not intended to replace advice given to you by your health care provider. Make sure you discuss any questions you have with your health care provider.

## 2014-06-09 ENCOUNTER — Telehealth: Payer: Self-pay | Admitting: *Deleted

## 2014-06-09 NOTE — Telephone Encounter (Signed)
Pt having Orthopedic Surgery Mon., Aug 3rd.  Requesting last OV note and labs prior to surgery.
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
## 2014-06-12 DIAGNOSIS — S82892A Other fracture of left lower leg, initial encounter for closed fracture: Secondary | ICD-10-CM | POA: Insufficient documentation

## 2014-06-12 HISTORY — DX: Other fracture of left lower leg, initial encounter for closed fracture: S82.892A

## 2014-07-02 ENCOUNTER — Other Ambulatory Visit: Payer: Self-pay | Admitting: Infectious Diseases

## 2014-07-21 ENCOUNTER — Encounter: Payer: Self-pay | Admitting: Infectious Diseases

## 2014-10-15 ENCOUNTER — Other Ambulatory Visit: Payer: Self-pay | Admitting: Internal Medicine

## 2014-10-15 DIAGNOSIS — B20 Human immunodeficiency virus [HIV] disease: Secondary | ICD-10-CM

## 2014-10-16 ENCOUNTER — Other Ambulatory Visit: Payer: Self-pay | Admitting: Infectious Diseases

## 2014-10-16 DIAGNOSIS — B20 Human immunodeficiency virus [HIV] disease: Secondary | ICD-10-CM

## 2014-10-17 ENCOUNTER — Other Ambulatory Visit: Payer: Medicaid Other

## 2014-10-17 DIAGNOSIS — B20 Human immunodeficiency virus [HIV] disease: Secondary | ICD-10-CM

## 2014-10-17 LAB — CBC WITH DIFFERENTIAL/PLATELET
Basophils Absolute: 0 10*3/uL (ref 0.0–0.1)
Basophils Relative: 0 % (ref 0–1)
EOS ABS: 0 10*3/uL (ref 0.0–0.7)
Eosinophils Relative: 1 % (ref 0–5)
HCT: 41.6 % (ref 39.0–52.0)
HEMOGLOBIN: 14.1 g/dL (ref 13.0–17.0)
LYMPHS ABS: 1.3 10*3/uL (ref 0.7–4.0)
LYMPHS PCT: 26 % (ref 12–46)
MCH: 32 pg (ref 26.0–34.0)
MCHC: 33.9 g/dL (ref 30.0–36.0)
MCV: 94.5 fL (ref 78.0–100.0)
MPV: 11.4 fL (ref 9.4–12.4)
Monocytes Absolute: 0.4 10*3/uL (ref 0.1–1.0)
Monocytes Relative: 9 % (ref 3–12)
NEUTROS PCT: 64 % (ref 43–77)
Neutro Abs: 3.1 10*3/uL (ref 1.7–7.7)
PLATELETS: 219 10*3/uL (ref 150–400)
RBC: 4.4 MIL/uL (ref 4.22–5.81)
RDW: 14.3 % (ref 11.5–15.5)
WBC: 4.9 10*3/uL (ref 4.0–10.5)

## 2014-10-18 LAB — T-HELPER CELL (CD4) - (RCID CLINIC ONLY)
CD4 % Helper T Cell: 38 % (ref 33–55)
CD4 T CELL ABS: 540 /uL (ref 400–2700)

## 2014-10-18 LAB — COMPLETE METABOLIC PANEL WITH GFR
ALT: 17 U/L (ref 0–53)
AST: 20 U/L (ref 0–37)
Albumin: 4.6 g/dL (ref 3.5–5.2)
Alkaline Phosphatase: 51 U/L (ref 39–117)
BUN: 16 mg/dL (ref 6–23)
CALCIUM: 9.3 mg/dL (ref 8.4–10.5)
CHLORIDE: 102 meq/L (ref 96–112)
CO2: 28 meq/L (ref 19–32)
Creat: 0.97 mg/dL (ref 0.50–1.35)
Glucose, Bld: 91 mg/dL (ref 70–99)
Potassium: 4.6 mEq/L (ref 3.5–5.3)
Sodium: 141 mEq/L (ref 135–145)
Total Bilirubin: 0.4 mg/dL (ref 0.2–1.2)
Total Protein: 7.1 g/dL (ref 6.0–8.3)

## 2014-10-18 LAB — HIV-1 RNA QUANT-NO REFLEX-BLD
HIV 1 RNA Quant: <20 copies/mL (ref <20)
HIV-1 RNA Quant, Log: <1.3 {Log} (ref <1.30)

## 2014-11-13 ENCOUNTER — Encounter: Payer: Self-pay | Admitting: Infectious Diseases

## 2014-11-13 ENCOUNTER — Ambulatory Visit (INDEPENDENT_AMBULATORY_CARE_PROVIDER_SITE_OTHER): Payer: Medicaid Other | Admitting: Infectious Diseases

## 2014-11-13 VITALS — BP 128/80 | HR 60 | Temp 98.1°F | Ht 68.0 in | Wt 165.0 lb

## 2014-11-13 DIAGNOSIS — C44519 Basal cell carcinoma of skin of other part of trunk: Secondary | ICD-10-CM

## 2014-11-13 DIAGNOSIS — B20 Human immunodeficiency virus [HIV] disease: Secondary | ICD-10-CM

## 2014-11-13 DIAGNOSIS — Z79899 Other long term (current) drug therapy: Secondary | ICD-10-CM

## 2014-11-13 DIAGNOSIS — B009 Herpesviral infection, unspecified: Secondary | ICD-10-CM

## 2014-11-13 DIAGNOSIS — A630 Anogenital (venereal) warts: Secondary | ICD-10-CM

## 2014-11-13 DIAGNOSIS — Z113 Encounter for screening for infections with a predominantly sexual mode of transmission: Secondary | ICD-10-CM

## 2014-11-13 DIAGNOSIS — Z23 Encounter for immunization: Secondary | ICD-10-CM

## 2014-11-13 NOTE — Assessment & Plan Note (Signed)
Not clear if he has recurrence or if this is condyloma on his back.

## 2014-11-13 NOTE — Assessment & Plan Note (Signed)
No further episodes. Will f/u prn.
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
## 2014-11-13 NOTE — Assessment & Plan Note (Signed)
Has been taking ACV every day, 800mg . Only has oral lesions, no genital.

## 2014-11-13 NOTE — Assessment & Plan Note (Addendum)
He is doing very well. Will continue his current meds. Wife is getting tested. Offered/refused condoms. Flu shot today. Tetanus shot today as well. rtc 6 months.

## 2014-11-17 NOTE — Addendum Note (Signed)
Addended by: Landis Gandy on: 11/17/2014 12:12 PM   Modules accepted: Orders

## 2015-03-22 ENCOUNTER — Other Ambulatory Visit: Payer: Self-pay | Admitting: *Deleted

## 2015-03-22 ENCOUNTER — Other Ambulatory Visit: Payer: Self-pay | Admitting: Infectious Diseases

## 2015-03-22 DIAGNOSIS — B20 Human immunodeficiency virus [HIV] disease: Secondary | ICD-10-CM

## 2015-03-22 MED ORDER — ACYCLOVIR 800 MG PO TABS: 800.0000 mg | ORAL_TABLET | Freq: Every day | ORAL | Status: DC

## 2015-03-22 MED ORDER — RITONAVIR 100 MG PO TABS: 100.0000 mg | ORAL_TABLET | Freq: Every day | ORAL | Status: DC

## 2015-03-22 MED ORDER — DARUNAVIR ETHANOLATE 800 MG PO TABS: 800.0000 mg | ORAL_TABLET | Freq: Every day | ORAL | Status: DC

## 2015-03-22 MED ORDER — EMTRICITABINE-TENOFOVIR DF 200-300 MG PO TABS: 1.0000 | ORAL_TABLET | Freq: Every day | ORAL | Status: DC

## 2015-06-27 ENCOUNTER — Other Ambulatory Visit: Payer: Self-pay
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
## 2015-08-01 ENCOUNTER — Other Ambulatory Visit: Payer: BLUE CROSS/BLUE SHIELD

## 2015-08-01 DIAGNOSIS — Z113 Encounter for screening for infections with a predominantly sexual mode of transmission: Secondary | ICD-10-CM

## 2015-08-01 DIAGNOSIS — B20 Human immunodeficiency virus [HIV] disease: Secondary | ICD-10-CM

## 2015-08-01 DIAGNOSIS — Z79899 Other long term (current) drug therapy: Secondary | ICD-10-CM

## 2015-08-01 LAB — CBC
HCT: 43.8 % (ref 39.0–52.0)
Hemoglobin: 15 g/dL (ref 13.0–17.0)
MCH: 32.8 pg (ref 26.0–34.0)
MCHC: 34.2 g/dL (ref 30.0–36.0)
MCV: 95.6 fL (ref 78.0–100.0)
MPV: 10.8 fL (ref 8.6–12.4)
PLATELETS: 212 10*3/uL (ref 150–400)
RBC: 4.58 MIL/uL (ref 4.22–5.81)
RDW: 14 % (ref 11.5–15.5)
WBC: 6.6 10*3/uL (ref 4.0–10.5)

## 2015-08-01 LAB — LIPID PANEL
Cholesterol: 193 mg/dL (ref 125–200)
HDL: 39 mg/dL — AB (ref >=40)
LDL CALC: 131 mg/dL — AB (ref <130)
Total CHOL/HDL Ratio: 4.9 Ratio (ref <=5.0)
Triglycerides: 116 mg/dL (ref <150)
VLDL: 23 mg/dL (ref <30)

## 2015-08-01 LAB — COMPREHENSIVE METABOLIC PANEL
ALT: 24 U/L (ref 9–46)
AST: 24 U/L (ref 10–40)
Albumin: 4.7 g/dL (ref 3.6–5.1)
Alkaline Phosphatase: 56 U/L (ref 40–115)
BILIRUBIN TOTAL: 0.7 mg/dL (ref 0.2–1.2)
BUN: 18 mg/dL (ref 7–25)
CALCIUM: 9.3 mg/dL (ref 8.6–10.3)
CO2: 29 mmol/L (ref 20–31)
CREATININE: 0.93 mg/dL (ref 0.60–1.35)
Chloride: 103 mmol/L (ref 98–110)
GLUCOSE: 109 mg/dL — AB (ref 65–99)
Potassium: 5.2 mmol/L (ref 3.5–5.3)
SODIUM: 139 mmol/L (ref 135–146)
Total Protein: 7.6 g/dL (ref 6.1–8.1)

## 2015-08-02 LAB — T-HELPER CELL (CD4) - (RCID CLINIC ONLY)
CD4 T CELL ABS: 620 /uL (ref 400–2700)
CD4 T CELL HELPER: 40 % (ref 33–55)

## 2015-08-02 LAB — HIV-1 RNA QUANT-NO REFLEX-BLD

## 2015-08-02 LAB — RPR

## 2015-08-15 ENCOUNTER — Ambulatory Visit (INDEPENDENT_AMBULATORY_CARE_PROVIDER_SITE_OTHER): Payer: BLUE CROSS/BLUE SHIELD | Admitting: Infectious Diseases

## 2015-08-15 ENCOUNTER — Encounter: Payer: Self-pay | Admitting: Infectious Diseases

## 2015-08-15 ENCOUNTER — Other Ambulatory Visit: Payer: Self-pay

## 2015-08-15 VITALS — BP 117/79 | HR 71 | Temp 98.1°F | Ht 68.0 in | Wt 180.0 lb

## 2015-08-15 DIAGNOSIS — C44519 Basal cell carcinoma of skin of other part of trunk: Secondary | ICD-10-CM

## 2015-08-15 DIAGNOSIS — A630 Anogenital (venereal) warts: Secondary | ICD-10-CM

## 2015-08-15 DIAGNOSIS — Z23 Encounter for immunization: Secondary | ICD-10-CM

## 2015-08-15 DIAGNOSIS — B20 Human immunodeficiency virus [HIV] disease: Secondary | ICD-10-CM | POA: Diagnosis not present

## 2015-08-15 DIAGNOSIS — Z79899 Other long term (current) drug therapy: Secondary | ICD-10-CM | POA: Diagnosis not present

## 2015-08-15 DIAGNOSIS — Z113 Encounter for screening for infections with a predominantly sexual mode of transmission: Secondary | ICD-10-CM | POA: Diagnosis not present

## 2015-08-15 DIAGNOSIS — R0789 Other chest pain: Secondary | ICD-10-CM

## 2015-08-15 MED ORDER — DARUNAVIR-COBICISTAT 800-150 MG PO TABS: 1.0000 | ORAL_TABLET | Freq: Every day | ORAL | Status: DC

## 2015-08-15 MED ORDER — EMTRICITABINE-TENOFOVIR AF 200-25 MG PO TABS: 1.0000 | ORAL_TABLET | Freq: Every day | ORAL | Status: DC

## 2015-08-15 NOTE — Assessment & Plan Note (Signed)
He is doing very well.  Would like to change him to descovy/DRVc to decrease his co-pays, comorbidities.  Will see him back in 4-5 months, labs prior Flu shot today.  Offered condoms.
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
## 2015-08-15 NOTE — Assessment & Plan Note (Signed)
No recurrence.  Will watch.

## 2015-08-15 NOTE — Assessment & Plan Note (Signed)
He has scar on his back from basal cell Ca. He has f/u appt.

## 2015-08-15 NOTE — Assessment & Plan Note (Addendum)
Will check ECG- NSR 61 bpm, ? volatage for LVH. Have asked him to try tums/maalox with next episode.  BP normal, he is going to quit smoking marijuana.

## 2015-08-16 ENCOUNTER — Other Ambulatory Visit: Payer: Self-pay | Admitting: Infectious Diseases

## 2015-09-19 ENCOUNTER — Other Ambulatory Visit: Payer: Self-pay | Admitting: Infectious Diseases

## 2015-10-18 ENCOUNTER — Other Ambulatory Visit: Payer: Self-pay | Admitting: Infectious Diseases

## 2015-10-22 ENCOUNTER — Telehealth: Payer: Self-pay | Admitting: *Deleted

## 2015-10-22 DIAGNOSIS — B20 Human immunodeficiency virus [HIV] disease: Secondary | ICD-10-CM

## 2015-10-22 MED ORDER — DARUNAVIR-COBICISTAT 800-150 MG PO TABS: 1.0000 | ORAL_TABLET | Freq: Every day | ORAL | Status: DC

## 2015-10-22 MED ORDER — EMTRICITABINE-TENOFOVIR AF 200-25 MG PO TABS: 1.0000 | ORAL_TABLET | Freq: Every day | ORAL | Status: DC

## 2015-10-22 NOTE — Telephone Encounter (Signed)
[employer] requiring pt to use "specialty" pharmacy for HIV rxes.  Co-pay card has run out of funding for HIV rxes.

## 2015-10-22 NOTE — Telephone Encounter (Signed)
Pt notified and will be expecting a call from [facility].

## 2016-01-14 ENCOUNTER — Other Ambulatory Visit: Payer: Self-pay | Admitting: Infectious Diseases

## 2016-01-14 DIAGNOSIS — B029 Zoster without complications: Secondary | ICD-10-CM

## 2016-02-07 ENCOUNTER — Other Ambulatory Visit: Payer: Self-pay

## 2016-04-01 ENCOUNTER — Ambulatory Visit: Payer: Self-pay | Admitting: Infectious Diseases

## 2016-04-08 ENCOUNTER — Other Ambulatory Visit: Payer: Self-pay

## 2016-04-14 ENCOUNTER — Other Ambulatory Visit: Payer: Self-pay

## 2016-04-14 DIAGNOSIS — Z113 Encounter for screening for infections with a predominantly sexual mode of transmission: Secondary | ICD-10-CM

## 2016-04-14 DIAGNOSIS — Z79899 Other long term (current) drug therapy: Secondary | ICD-10-CM

## 2016-04-14 DIAGNOSIS — B20 Human immunodeficiency virus [HIV] disease: Secondary | ICD-10-CM

## 2016-04-14 LAB — COMPLETE METABOLIC PANEL WITH GFR
ALT: 12 U/L (ref 9–46)
AST: 15 U/L (ref 10–40)
Albumin: 4.3 g/dL (ref 3.6–5.1)
Alkaline Phosphatase: 52 U/L (ref 40–115)
BUN: 16 mg/dL (ref 7–25)
CHLORIDE: 104 mmol/L (ref 98–110)
CO2: 28 mmol/L (ref 20–31)
Calcium: 8.8 mg/dL (ref 8.6–10.3)
Creat: 0.95 mg/dL (ref 0.60–1.35)
Glucose, Bld: 86 mg/dL (ref 65–99)
POTASSIUM: 4.4 mmol/L (ref 3.5–5.3)
Sodium: 141 mmol/L (ref 135–146)
Total Bilirubin: 0.4 mg/dL (ref 0.2–1.2)
Total Protein: 7.2 g/dL (ref 6.1–8.1)

## 2016-04-14 LAB — LIPID PANEL
CHOL/HDL RATIO: 5.4 ratio — AB (ref <=5.0)
Cholesterol: 179 mg/dL (ref 125–200)
HDL: 33 mg/dL — AB (ref >=40)
LDL CALC: 77 mg/dL (ref <130)
TRIGLYCERIDES: 346 mg/dL — AB (ref <150)
VLDL: 69 mg/dL — AB (ref <30)

## 2016-04-14 LAB — CBC
HCT: 42.7 % (ref 38.5–50.0)
Hemoglobin: 14.3 g/dL (ref 13.2–17.1)
MCH: 32.6 pg (ref 27.0–33.0)
MCHC: 33.5 g/dL (ref 32.0–36.0)
MCV: 97.3 fL (ref 80.0–100.0)
MPV: 11.4 fL (ref 7.5–12.5)
PLATELETS: 207 10*3/uL (ref 140–400)
RBC: 4.39 MIL/uL (ref 4.20–5.80)
RDW: 13.6 % (ref 11.0–15.0)
WBC: 5.6 10*3/uL (ref 3.8–10.8)

## 2016-04-15 LAB — T-HELPER CELL (CD4) - (RCID CLINIC ONLY)
CD4 % Helper T Cell: 44 % (ref 33–55)
CD4 T Cell Abs: 820 /uL (ref 400–2700)

## 2016-04-15 LAB — HIV-1 RNA QUANT-NO REFLEX-BLD
HIV 1 RNA Quant: 30 copies/mL — ABNORMAL HIGH (ref <20)
HIV-1 RNA QUANT, LOG: 1.48 {Log_copies}/mL — AB (ref <1.30)

## 2016-04-15 LAB — RPR

## 2016-04-22 ENCOUNTER — Other Ambulatory Visit: Payer: Self-pay | Admitting: *Deleted

## 2016-04-22 DIAGNOSIS — B029 Zoster without complications: Secondary | ICD-10-CM

## 2016-04-22 MED ORDER — ACYCLOVIR 800 MG PO TABS: ORAL_TABLET | ORAL | Status: DC

## 2016-04-28 ENCOUNTER — Encounter: Payer: Self-pay | Admitting: *Deleted

## 2016-04-28 ENCOUNTER — Encounter (INDEPENDENT_AMBULATORY_CARE_PROVIDER_SITE_OTHER): Payer: Self-pay

## 2016-04-28 ENCOUNTER — Ambulatory Visit: Payer: Self-pay | Admitting: Infectious Diseases

## 2016-06-25 ENCOUNTER — Ambulatory Visit: Payer: Self-pay | Admitting: Infectious Diseases

## 2016-07-04 ENCOUNTER — Ambulatory Visit: Payer: Self-pay | Admitting: Infectious Diseases

## 2016-09-29 ENCOUNTER — Telehealth: Payer: Self-pay | Admitting: *Deleted

## 2016-09-29 NOTE — Telephone Encounter (Signed)
Patient's last MD appt was 2016.  Needs OV appt.  Appointment made with Dr. Johnnye Sima for 10/15/16.  Patient shared that he has run out of Prescobix and Descovy.  When this occurred he started taking his "old" regimen medications that he had "on hand."  Dr. Johnnye Sima, please advise about refilling Precobix/Descovy with this history from the patient.

## 2016-09-30 ENCOUNTER — Encounter: Payer: Self-pay | Admitting: Infectious Diseases

## 2016-09-30 ENCOUNTER — Other Ambulatory Visit: Payer: Self-pay | Admitting: *Deleted

## 2016-09-30 DIAGNOSIS — B029 Zoster without complications: Secondary | ICD-10-CM

## 2016-09-30 DIAGNOSIS — B20 Human immunodeficiency virus [HIV] disease: Secondary | ICD-10-CM

## 2016-09-30 MED ORDER — DARUNAVIR-COBICISTAT 800-150 MG PO TABS: 1.0000 | ORAL_TABLET | Freq: Every day | ORAL | 3 refills | Status: DC

## 2016-09-30 MED ORDER — ACYCLOVIR 800 MG PO TABS: ORAL_TABLET | ORAL | 5 refills | Status: DC

## 2016-09-30 NOTE — Telephone Encounter (Signed)
Please refill prezcobix, descovy for 60 days

## 2016-10-15 ENCOUNTER — Ambulatory Visit (INDEPENDENT_AMBULATORY_CARE_PROVIDER_SITE_OTHER): Payer: No Typology Code available for payment source | Admitting: Infectious Diseases

## 2016-10-15 ENCOUNTER — Encounter: Payer: Self-pay | Admitting: Infectious Diseases

## 2016-10-15 VITALS — BP 122/81 | HR 62 | Temp 98.1°F | Ht 68.0 in | Wt 199.0 lb

## 2016-10-15 DIAGNOSIS — B20 Human immunodeficiency virus [HIV] disease: Secondary | ICD-10-CM | POA: Diagnosis not present

## 2016-10-15 DIAGNOSIS — Z23 Encounter for immunization: Secondary | ICD-10-CM | POA: Diagnosis not present

## 2016-10-15 DIAGNOSIS — A630 Anogenital (venereal) warts: Secondary | ICD-10-CM | POA: Diagnosis not present

## 2016-10-15 DIAGNOSIS — F419 Anxiety disorder, unspecified: Secondary | ICD-10-CM

## 2016-10-15 MED ORDER — EMTRICITABINE-TENOFOVIR AF 200-25 MG PO TABS: 1.0000 | ORAL_TABLET | Freq: Every day | ORAL | 3 refills | Status: DC

## 2016-10-15 MED ORDER — DARUNAVIR-COBICISTAT 800-150 MG PO TABS: 1.0000 | ORAL_TABLET | Freq: Every day | ORAL | 3 refills | Status: DC

## 2016-10-15 NOTE — Assessment & Plan Note (Signed)
Doing well Will check labs today Offered/refused condoms.  Gets flu shot today.  Will see him back in 12 months.

## 2016-10-15 NOTE — Assessment & Plan Note (Signed)
Has had no recurrences by his hx.
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
## 2016-10-15 NOTE — Assessment & Plan Note (Signed)
Has been doing better controlling his anxiety and mood.

## 2016-10-16 LAB — T-HELPER CELL (CD4) - (RCID CLINIC ONLY)
CD4 % Helper T Cell: 37 % (ref 33–55)
CD4 T Cell Abs: 730 /uL (ref 400–2700)

## 2016-10-17 LAB — HIV-1 RNA QUANT-NO REFLEX-BLD: HIV-1 RNA Quant, Log: <1.3 Log copies/mL (ref <1.30)

## 2017-01-06 ENCOUNTER — Encounter: Payer: Self-pay | Admitting: Infectious Diseases

## 2017-01-06 ENCOUNTER — Telehealth: Payer: Self-pay | Admitting: *Deleted

## 2017-01-06 NOTE — Telephone Encounter (Signed)
Patient requested appointment via my chart to see Dr. Johnnye Sima for a painful bump near his rectum. Scheduled appt for 01/28/17. Patient does have PCP and advised he could be seen sooner there. He said he is not comfortable being seen for this by his PCP.

## 2017-01-28 ENCOUNTER — Ambulatory Visit (INDEPENDENT_AMBULATORY_CARE_PROVIDER_SITE_OTHER): Payer: No Typology Code available for payment source | Admitting: Infectious Diseases

## 2017-01-28 ENCOUNTER — Encounter: Payer: Self-pay | Admitting: Infectious Diseases

## 2017-01-28 VITALS — BP 131/81 | HR 69 | Temp 98.3°F | Wt 194.0 lb

## 2017-01-28 DIAGNOSIS — B20 Human immunodeficiency virus [HIV] disease: Secondary | ICD-10-CM

## 2017-01-28 DIAGNOSIS — F339 Major depressive disorder, recurrent, unspecified: Secondary | ICD-10-CM

## 2017-01-28 DIAGNOSIS — A630 Anogenital (venereal) warts: Secondary | ICD-10-CM | POA: Diagnosis not present

## 2017-01-28 DIAGNOSIS — Z789 Other specified health status: Secondary | ICD-10-CM | POA: Diagnosis not present

## 2017-01-28 DIAGNOSIS — Z113 Encounter for screening for infections with a predominantly sexual mode of transmission: Secondary | ICD-10-CM | POA: Diagnosis not present

## 2017-01-28 DIAGNOSIS — Z79899 Other long term (current) drug therapy: Secondary | ICD-10-CM

## 2017-01-28 LAB — CBC
HEMATOCRIT: 43.4 % (ref 38.5–50.0)
HEMOGLOBIN: 14.7 g/dL (ref 13.2–17.1)
MCH: 32.7 pg (ref 27.0–33.0)
MCHC: 33.9 g/dL (ref 32.0–36.0)
MCV: 96.4 fL (ref 80.0–100.0)
MPV: 10.8 fL (ref 7.5–12.5)
Platelets: 240 10*3/uL (ref 140–400)
RBC: 4.5 MIL/uL (ref 4.20–5.80)
RDW: 13.8 % (ref 11.0–15.0)
WBC: 5.7 10*3/uL (ref 3.8–10.8)

## 2017-01-28 LAB — COMPREHENSIVE METABOLIC PANEL
ALBUMIN: 4.5 g/dL (ref 3.6–5.1)
ALT: 21 U/L (ref 9–46)
AST: 20 U/L (ref 10–40)
Alkaline Phosphatase: 60 U/L (ref 40–115)
BILIRUBIN TOTAL: 0.4 mg/dL (ref 0.2–1.2)
BUN: 17 mg/dL (ref 7–25)
CALCIUM: 9.4 mg/dL (ref 8.6–10.3)
CO2: 29 mmol/L (ref 20–31)
CREATININE: 0.99 mg/dL (ref 0.60–1.35)
Chloride: 102 mmol/L (ref 98–110)
GLUCOSE: 91 mg/dL (ref 65–99)
POTASSIUM: 4.5 mmol/L (ref 3.5–5.3)
Sodium: 139 mmol/L (ref 135–146)
Total Protein: 7.4 g/dL (ref 6.1–8.1)

## 2017-01-28 LAB — LIPID PANEL
Cholesterol: 233 mg/dL — ABNORMAL HIGH (ref <200)
HDL: 38 mg/dL — AB (ref >40)
LDL CALC: 133 mg/dL — AB (ref <100)
TRIGLYCERIDES: 308 mg/dL — AB (ref <150)
Total CHOL/HDL Ratio: 6.1 Ratio — ABNORMAL HIGH (ref <5.0)
VLDL: 62 mg/dL — ABNORMAL HIGH (ref <30)
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
## 2017-01-28 NOTE — Assessment & Plan Note (Signed)
Will send him to [facility] for further eval.  He is worried that wart is sitting on a hemmorrhoid causing it to enlarge.

## 2017-01-28 NOTE — Assessment & Plan Note (Signed)
Much improved off marijuana

## 2017-01-28 NOTE — Assessment & Plan Note (Signed)
He is doing we Will check his labs [employer]- defers Offered/refused condoms.  Will se him back in 6 months.

## 2017-01-29 LAB — RPR

## 2017-01-29 LAB — T-HELPER CELL (CD4) - (RCID CLINIC ONLY)
CD4 T CELL ABS: 780 /uL (ref 400–2700)
CD4 T CELL HELPER: 42 % (ref 33–55)

## 2017-01-30 LAB — HIV-1 RNA QUANT-NO REFLEX-BLD
HIV 1 RNA Quant: 148 copies/mL — ABNORMAL HIGH
HIV-1 RNA Quant, Log: 2.17 Log copies/mL — ABNORMAL HIGH

## 2017-02-18 ENCOUNTER — Encounter: Payer: Self-pay | Admitting: Infectious Diseases

## 2017-02-18 NOTE — Telephone Encounter (Signed)
Patient is asking about his referral to [facility].  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 Please reach out to patient regarding this referral. Landis Gandy, RN

## 2017-04-04 ENCOUNTER — Other Ambulatory Visit: Payer: Self-pay | Admitting: Infectious Diseases

## 2017-04-04 DIAGNOSIS — B029 Zoster without complications: Secondary | ICD-10-CM

## 2017-09-04 ENCOUNTER — Other Ambulatory Visit: Payer: Self-pay | Admitting: Infectious Diseases

## 2017-09-04 DIAGNOSIS — B029 Zoster without complications: Secondary | ICD-10-CM

## 2017-10-26 ENCOUNTER — Other Ambulatory Visit: Payer: Self-pay | Admitting: *Deleted

## 2017-10-26 ENCOUNTER — Encounter: Payer: Self-pay | Admitting: Infectious Diseases

## 2017-10-26 DIAGNOSIS — B20 Human immunodeficiency virus [HIV] disease: Secondary | ICD-10-CM

## 2017-10-26 DIAGNOSIS — B029 Zoster without complications: Secondary | ICD-10-CM

## 2017-10-26 MED ORDER — DARUNAVIR-COBICISTAT 800-150 MG PO TABS: 1.0000 | ORAL_TABLET | Freq: Every day | ORAL | 1 refills | Status: DC

## 2017-10-26 MED ORDER — ACYCLOVIR 800 MG PO TABS: 800.0000 mg | ORAL_TABLET | Freq: Every day | ORAL | 1 refills | Status: DC

## 2017-10-26 MED ORDER — EMTRICITABINE-TENOFOVIR AF 200-25 MG PO TABS: 1.0000 | ORAL_TABLET | Freq: Every day | ORAL | 1 refills | Status: DC

## 2017-11-11 ENCOUNTER — Other Ambulatory Visit: Payer: Self-pay

## 2017-11-18 ENCOUNTER — Ambulatory Visit: Payer: BLUE CROSS/BLUE SHIELD | Admitting: Infectious Diseases

## 2017-11-18 ENCOUNTER — Encounter: Payer: Self-pay | Admitting: Infectious Diseases

## 2017-11-18 VITALS — BP 131/85 | HR 64 | Temp 98.3°F | Ht 68.0 in | Wt 200.0 lb

## 2017-11-18 DIAGNOSIS — A630 Anogenital (venereal) warts: Secondary | ICD-10-CM

## 2017-11-18 DIAGNOSIS — Z113 Encounter for screening for infections with a predominantly sexual mode of transmission: Secondary | ICD-10-CM | POA: Diagnosis not present

## 2017-11-18 DIAGNOSIS — B20 Human immunodeficiency virus [HIV] disease: Secondary | ICD-10-CM | POA: Diagnosis not present

## 2017-11-18 DIAGNOSIS — Z79899 Other long term (current) drug therapy: Secondary | ICD-10-CM | POA: Diagnosis not present

## 2017-11-18 NOTE — Assessment & Plan Note (Signed)
Will change him to symtuza if his insurance covers Has gotten flu vax Wants to defer mening vax.  Will check labs today Will see back in 12 months Offered/refused condoms. Has at home.

## 2017-11-18 NOTE — Addendum Note (Signed)
Addended by: Dolan Amen D on: 11/18/2017 09:55 AM   Modules accepted: Orders
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
## 2017-11-18 NOTE — Assessment & Plan Note (Signed)
Was seen at [facility]- was treated. Still owes them $. He will f/u.  Has single wart now, unchanged.

## 2017-11-19 LAB — COMPREHENSIVE METABOLIC PANEL
AG Ratio: 1.6 (calc) (ref 1.0–2.5)
ALBUMIN MSPROF: 4.7 g/dL (ref 3.6–5.1)
ALKALINE PHOSPHATASE (APISO): 61 U/L (ref 40–115)
ALT: 13 U/L (ref 9–46)
AST: 15 U/L (ref 10–40)
BUN: 19 mg/dL (ref 7–25)
CO2: 26 mmol/L (ref 20–32)
CREATININE: 1 mg/dL (ref 0.60–1.35)
Calcium: 9.5 mg/dL (ref 8.6–10.3)
Chloride: 103 mmol/L (ref 98–110)
GLUCOSE: 133 mg/dL — AB (ref 65–99)
Globulin: 3 g/dL (calc) (ref 1.9–3.7)
POTASSIUM: 4.3 mmol/L (ref 3.5–5.3)
Sodium: 138 mmol/L (ref 135–146)
Total Bilirubin: 0.5 mg/dL (ref 0.2–1.2)
Total Protein: 7.7 g/dL (ref 6.1–8.1)

## 2017-11-19 LAB — T-HELPER CELL (CD4) - (RCID CLINIC ONLY)
CD4 % Helper T Cell: 30 % — ABNORMAL LOW (ref 33–55)
CD4 T CELL ABS: 430 /uL (ref 400–2700)

## 2017-11-19 LAB — CBC
HEMATOCRIT: 41.4 % (ref 38.5–50.0)
HEMOGLOBIN: 14.6 g/dL (ref 13.2–17.1)
MCH: 32.7 pg (ref 27.0–33.0)
MCHC: 35.3 g/dL (ref 32.0–36.0)
MCV: 92.6 fL (ref 80.0–100.0)
MPV: 11.4 fL (ref 7.5–12.5)
Platelets: 228 10*3/uL (ref 140–400)
RBC: 4.47 10*6/uL (ref 4.20–5.80)
RDW: 12.8 % (ref 11.0–15.0)
WBC: 8.1 10*3/uL (ref 3.8–10.8)

## 2017-11-19 LAB — RPR: RPR: NONREACTIVE

## 2017-11-19 LAB — LIPID PANEL
CHOL/HDL RATIO: 5.6 (calc) — AB (ref <5.0)
Cholesterol: 224 mg/dL — ABNORMAL HIGH (ref <200)
HDL: 40 mg/dL — AB (ref >40)
LDL Cholesterol (Calc): 153 mg/dL (calc) — ABNORMAL HIGH
Non-HDL Cholesterol (Calc): 184 mg/dL (calc) — ABNORMAL HIGH (ref <130)
TRIGLYCERIDES: 173 mg/dL — AB (ref <150)

## 2017-11-20 LAB — HIV-1 RNA QUANT-NO REFLEX-BLD
HIV 1 RNA QUANT: DETECTED {copies}/mL — AB
HIV-1 RNA Quant, Log: <1.3 Log copies/mL — AB

## 2018-03-11 ENCOUNTER — Encounter: Payer: Self-pay | Admitting: Infectious Diseases

## 2018-03-11 DIAGNOSIS — B20 Human immunodeficiency virus [HIV] disease: Secondary | ICD-10-CM

## 2018-03-12 ENCOUNTER — Telehealth: Payer: Self-pay | Admitting: *Deleted

## 2018-03-12 MED ORDER — DARUN-COBIC-EMTRICIT-TENOFAF 800-150-200-10 MG PO TABS: 1.0000 | ORAL_TABLET | Freq: Every day | ORAL | 1 refills | Status: DC

## 2018-03-12 NOTE — Telephone Encounter (Signed)
No prior authorization needed when patient uses preferred pharmacy ([facility]).  RN called patient, he has moved to [facility] and would [facility] [place], [place].   Patient has an appointment with previous provider to reestablish care. He will send a signed release for records if needed. Landis Gandy, RN

## 2018-06-01 ENCOUNTER — Other Ambulatory Visit: Payer: Self-pay | Admitting: Infectious Diseases

## 2018-06-01 DIAGNOSIS — B029 Zoster without complications: Secondary | ICD-10-CM

## 2021-10-29 ENCOUNTER — Encounter: Payer: Self-pay | Admitting: Infectious Diseases

## 2021-11-28 ENCOUNTER — Encounter: Payer: Self-pay | Admitting: Internal Medicine

## 2021-11-28 ENCOUNTER — Ambulatory Visit (INDEPENDENT_AMBULATORY_CARE_PROVIDER_SITE_OTHER): Payer: BC Managed Care – PPO | Admitting: Internal Medicine

## 2021-11-28 ENCOUNTER — Other Ambulatory Visit: Payer: Self-pay

## 2021-11-28 VITALS — BP 132/88 | HR 94 | Temp 98.5°F | Ht 68.0 in | Wt 185.0 lb

## 2021-11-28 DIAGNOSIS — B20 Human immunodeficiency virus [HIV] disease: Secondary | ICD-10-CM

## 2021-11-28 DIAGNOSIS — F419 Anxiety disorder, unspecified: Secondary | ICD-10-CM | POA: Diagnosis not present

## 2021-11-28 DIAGNOSIS — E785 Hyperlipidemia, unspecified: Secondary | ICD-10-CM | POA: Diagnosis not present

## 2021-11-28 DIAGNOSIS — A630 Anogenital (venereal) warts: Secondary | ICD-10-CM

## 2021-11-28 MED ORDER — VENLAFAXINE HCL ER 150 MG PO CP24: 150.0000 mg | ORAL_CAPSULE | Freq: Every day | ORAL | 11 refills | Status: DC

## 2021-11-28 MED ORDER — BIKTARVY 50-200-25 MG PO TABS: 1.0000 | ORAL_TABLET | Freq: Every day | ORAL | 11 refills | Status: DC

## 2021-11-28 NOTE — Progress Notes (Signed)
[facility] [facility]  Reason for Consult: HIV patient Referring Provider: [facility]   HPI:    Gary Lindsey is a 51 y.o. male with a past medical history as below who presents to clinic as a patient for HIV care.    Patient was previously in care at our clinic in 2019.  He was last seen in January of that year by Dr. Johnnye Sima.  At that time he was doing well with a viral load of less than 20 and a CD4 count of 430.  He was on Symtuza.  He then moved to [place], [place] and was in care at [facility] and was switched to [facility].  He reports that he has been adherent with his antiretroviral therapy recently now that he is on his wife's insurance but while in [place] he was briefly taking Biktarvy 4 days on and 3 days off due to having some difficulty obtaining all his meds.  He was in [place] just prior to moving back to [place].  His most recent lab work is from 10/15/2020 at which time his CD4 count was 877 and viral load less than 20.  His RPR was nonreactive, creatinine 1.1.     Patient's Medications  New Prescriptions   No medications on file  Previous Medications   No medications on file  Modified Medications   Modified Medication Previous Medication   BIKTARVY 50-200-25 MG TABS TABLET BIKTARVY 50-200-25 MG TABS tablet      Take 1 tablet by mouth daily.    Take 1 tablet by mouth daily.   VENLAFAXINE XR (EFFEXOR-XR) 150 MG 24 HR CAPSULE venlafaxine XR (EFFEXOR-XR) 150 MG 24 hr capsule      Take 1 capsule (150 mg total) by mouth daily.    Take 150 mg by mouth daily.  Discontinued Medications   ACYCLOVIR (ZOVIRAX) 800 MG TABLET    Take 1 tablet (800 mg total) by mouth daily.   ATORVASTATIN (LIPITOR) 10 MG TABLET    Take 10 mg by mouth daily.   DARUNAVIR-COBICISCTAT-EMTRICITABINE-TENOFOVIR ALAFENAMIDE (SYMTUZA) 800-150-200-10 MG TABS    Take 1 tablet by mouth daily with breakfast.   DARUNAVIR-COBICISTAT (PREZCOBIX) 800-150 MG TABLET    Take 1 tablet by mouth daily.  Swallow whole. Do NOT crush, break or chew tablets. Take with food.   EMTRICITABINE-TENOFOVIR AF (DESCOVY) 200-25 MG TABLET    Take 1 tablet by mouth daily.      Past Medical History:  Diagnosis Date   Anxiety    Basal cell carcinoma of back    Chest pain    Closed left ankle fracture 06-12-2014   plating   DEPRESSION    HERPES SIMPLEX INFECTION    HIV infection ([place])    INSOMNIA    Urethral discharge     Social History   Tobacco Use   Smoking status: Former   Smokeless tobacco: Never  Substance Use Topics   Alcohol use: Yes    Alcohol/week: 1.0 standard drink    Types: 1 Standard drinks or equivalent per week    Comment: occ   Drug use: Yes    Types: Marijuana    Comment: occasional    Family History  Problem Relation Age of Onset   Cancer Mother        cervical   Hypertension Father    Hyperlipidemia Father     No Known Allergies  Review of Systems  Constitutional: Negative.   Respiratory: Negative.  Cardiovascular: Negative.   Gastrointestinal: Negative.   Genitourinary: Negative.     OBJECTIVE:    Vitals:   11/28/21 1404  BP: 132/88  Pulse: 94  Temp: 98.5 F (36.9 C)  TempSrc: Oral  SpO2: 97%  Weight: 185 lb (83.9 kg)  Height: 5\' 8"  (1.727 m)     Body mass index is 28.13 kg/m.   Physical Exam Constitutional:      General: He is not in acute distress.    Appearance: Normal appearance.  HENT:     Head: Normocephalic and atraumatic.  Pulmonary:     Effort: Pulmonary effort is normal. No respiratory distress.  Musculoskeletal:        General: Normal range of motion.  Skin:    General: Skin is warm and dry.  Neurological:     General: No focal deficit present.     Mental Status: He is alert and oriented to person, place, and time.    Labs and Microbiology: CMP Latest Ref Rng & Units 11/18/2017 01/28/2017 04/14/2016  Glucose 65 - 99 mg/dL 133(H) 91 86  BUN 7 - 25 mg/dL 19 17 16   Creatinine 0.60 - 1.35 mg/dL 1.00 0.99 0.95  Sodium 135  - 146 mmol/L 138 139 141  Potassium 3.5 - 5.3 mmol/L 4.3 4.5 4.4  Chloride 98 - 110 mmol/L 103 102 104  CO2 20 - 32 mmol/L 26 29 28   Calcium 8.6 - 10.3 mg/dL 9.5 9.4 8.8  Total Protein 6.1 - 8.1 g/dL 7.7 7.4 7.2  Total Bilirubin 0.2 - 1.2 mg/dL 0.5 0.4 0.4  Alkaline Phos 40 - 115 U/L - 60 52  AST 10 - 40 U/L 15 20 15   ALT 9 - 46 U/L 13 21 12    CBC Latest Ref Rng & Units 11/18/2017 01/28/2017 04/14/2016  WBC 3.8 - 10.8 Thousand/uL 8.1 5.7 5.6  Hemoglobin 13.2 - 17.1 g/dL 14.6 14.7 14.3  Hematocrit 38.5 - 50.0 % 41.4 43.4 42.7  Platelets 140 - 400 Thousand/uL 228 240 207     Lab Results  Component Value Date   HIV1RNAQUANT <20 DETECTED (A) 11/18/2017   HIV1RNAQUANT 148 (H) 01/28/2017   HIV1RNAQUANT <20 10/15/2016   CD4TABS 430 11/18/2017   CD4TABS 780 01/28/2017   CD4TABS 730 10/15/2016    RPR and STI: Lab Results  Component Value Date   LABRPR NON-REACTIVE 11/18/2017   LABRPR NON REAC 01/28/2017   LABRPR NON REAC 04/14/2016   LABRPR NON REAC 08/01/2015   LABRPR NON REAC 02/08/2014    STI Results CT  Latest Ref Rng & Units NEGATIVE  09/17/2010 NEGATIVE    Hepatitis B: Lab Results  Component Value Date   HEPBSAB POS (A) 09/17/2010   HEPBSAG NEG 09/17/2010   HEPBCAB NEG 09/17/2010   Hepatitis C: No results found for: HEPCAB, HCVRNAPCRQN Hepatitis A: Lab Results  Component Value Date   HAV POS (A) 09/17/2010   Lipids: Lab Results  Component Value Date   CHOL 224 (H) 11/18/2017   TRIG 173 (H) 11/18/2017   HDL 40 (L) 11/18/2017   CHOLHDL 5.6 (H) 11/18/2017   VLDL 62 (H) 01/28/2017   LDLCALC 153 (H) 11/18/2017     ASSESSMENT & PLAN:    Anal warts Has previously been seen at [facility] surgery.  He was followed at the anal dysplasia clinic at [facility] by Dr. Antionette Char and was last seen in October 2019 at which time he was noted to have anal canal and perianal warts.  Cytology at that time  showed low-grade squamous epithelial lesions.  He declines referral back  to [facility] today and states he has no current issues/concerns.  Hyperlipidemia He was previously on Atorvastatin but has only been taking intermittently.  Feels that his lipids are probably improved with diet modifications.  Will check lipids today.   Human immunodeficiency virus (HIV) disease ([place]) He has been adherent on Biktarvy of late and will check labs today.  Refills sent on his medications.  If all looks okay will follow up in 12 months.   Anxiety Doing well currently on Venlafaxine.  Refills sent today.  Declines referral to counseling.  States he does better now that he is retired and spending more time with wife and 23 year old boy.    Orders Placed This Encounter  Procedures   HIV-1 RNA quant-no reflex-bld   CBC   COMPLETE METABOLIC PANEL WITH GFR   T-helper cell (CD4)- ([facility] clinic only)   RPR   Lipid panel   Urinalysis, Routine w reflex microscopic   Ambulatory referral to Internal Medicine    Referral Priority:   Routine    Referral Type:   Consultation    Referral Reason:   Specialty Services Required    Requested Specialty:   Internal Medicine    Number of Visits Requested:   [place] [facility] 11/28/2021, 2:33 PM   I spent 40 minutes dedicated to the care of this patient on the date of this encounter to include pre-visit review of records, face-to-face time with the patient discussing HIV, HLD, warts and [place]-visit ordering of testing.

## 2021-11-28 NOTE — Assessment & Plan Note (Signed)
He has been adherent on Biktarvy of late and will check labs today.  Refills sent on his medications.  If all looks okay will follow up in 12 months.

## 2021-11-28 NOTE — Assessment & Plan Note (Signed)
Doing well currently on Venlafaxine.  Refills sent today.  Declines referral to counseling.  States he does better now that he is retired and spending more time with wife and 51 year old boy.

## 2021-11-28 NOTE — Assessment & Plan Note (Signed)
Has previously been seen at [facility] surgery.  He was followed at the anal dysplasia clinic at [facility] by Dr. Antionette Char and was last seen in October 2019 at which time he was noted to have anal canal and perianal warts.  Cytology at that time showed low-grade squamous epithelial lesions.  He declines referral back to [facility] today and states he has no current issues/concerns.

## 2021-11-28 NOTE — Assessment & Plan Note (Signed)
He was previously on Atorvastatin but has only been taking intermittently.  Feels that his lipids are probably improved with diet modifications.  Will check lipids today.

## 2021-11-29 LAB — T-HELPER CELL (CD4) - (RCID CLINIC ONLY)
CD4 % Helper T Cell: 48 % (ref 33–65)
CD4 T Cell Abs: 776 /uL (ref 400–1790)

## 2021-11-30 ENCOUNTER — Encounter: Payer: Self-pay | Admitting: Internal Medicine

## 2021-11-30 LAB — LIPID PANEL
Cholesterol: 229 mg/dL — ABNORMAL HIGH (ref <200)
HDL: 34 mg/dL — ABNORMAL LOW (ref >=40)
LDL Cholesterol (Calc): 158 mg/dL (calc) — ABNORMAL HIGH
Non-HDL Cholesterol (Calc): 195 mg/dL (calc) — ABNORMAL HIGH (ref <130)
Total CHOL/HDL Ratio: 6.7 (calc) — ABNORMAL HIGH (ref <5.0)
Triglycerides: 209 mg/dL — ABNORMAL HIGH (ref <150)

## 2021-11-30 LAB — COMPLETE METABOLIC PANEL WITHOUT GFR
AG Ratio: 1.4 (calc) (ref 1.0–2.5)
ALT: 11 U/L (ref 9–46)
AST: 14 U/L (ref 10–35)
Albumin: 4.6 g/dL (ref 3.6–5.1)
Alkaline phosphatase (APISO): 83 U/L (ref 35–144)
BUN: 13 mg/dL (ref 7–25)
CO2: 28 mmol/L (ref 20–32)
Calcium: 9.8 mg/dL (ref 8.6–10.3)
Chloride: 104 mmol/L (ref 98–110)
Creat: 1.22 mg/dL (ref 0.70–1.30)
Globulin: 3.3 g/dL (ref 1.9–3.7)
Glucose, Bld: 153 mg/dL — ABNORMAL HIGH (ref 65–99)
Potassium: 3.9 mmol/L (ref 3.5–5.3)
Sodium: 140 mmol/L (ref 135–146)
Total Bilirubin: 0.6 mg/dL (ref 0.2–1.2)
Total Protein: 7.9 g/dL (ref 6.1–8.1)
eGFR: 72 mL/min/1.73m2

## 2021-11-30 LAB — URINALYSIS, ROUTINE W REFLEX MICROSCOPIC
Bacteria, UA: NONE SEEN /HPF
Bilirubin Urine: NEGATIVE
Hgb urine dipstick: NEGATIVE
Hyaline Cast: NONE SEEN /LPF
Leukocytes,Ua: NEGATIVE
Nitrite: NEGATIVE
RBC / HPF: NONE SEEN /HPF (ref 0–2)
Specific Gravity, Urine: 1.035 (ref 1.001–1.035)
Squamous Epithelial / HPF: NONE SEEN /HPF
pH: <=5 (ref 5.0–8.0)

## 2021-11-30 LAB — HIV-1 RNA QUANT-NO REFLEX-BLD
HIV 1 RNA Quant: NOT DETECTED {copies}/mL
HIV-1 RNA Quant, Log: NOT DETECTED {Log_copies}/mL

## 2021-11-30 LAB — CBC
HCT: 44.3 % (ref 38.5–50.0)
Hemoglobin: 15.2 g/dL (ref 13.2–17.1)
MCH: 31.9 pg (ref 27.0–33.0)
MCHC: 34.3 g/dL (ref 32.0–36.0)
MCV: 92.9 fL (ref 80.0–100.0)
MPV: 11 fL (ref 7.5–12.5)
Platelets: 295 Thousand/uL (ref 140–400)
RBC: 4.77 Million/uL (ref 4.20–5.80)
RDW: 12.6 % (ref 11.0–15.0)
WBC: 9.3 Thousand/uL (ref 3.8–10.8)

## 2021-11-30 LAB — RPR: RPR Ser Ql: NONREACTIVE

## 2021-12-03 ENCOUNTER — Telehealth: Payer: Self-pay

## 2021-12-03 NOTE — Telephone Encounter (Signed)
Left voicemail asking patient to return my call.   Darlin Stenseth P Nialah Saravia, CMA  

## 2021-12-03 NOTE — Telephone Encounter (Signed)
-----   Message from Mignon Pine, DO sent at 12/03/2021  9:20 AM EST ----- Please let patient know that labs look good and viral load is undetectable on Biktarvy.  No changes to ART and I received his message about restarting his statin.  I think that is a good idea given elevated lipids from last week.

## 2021-12-04 NOTE — Telephone Encounter (Signed)
Patient has read result note on mychart.

## 2022-02-25 ENCOUNTER — Other Ambulatory Visit (HOSPITAL_COMMUNITY): Payer: Self-pay

## 2022-02-27 ENCOUNTER — Other Ambulatory Visit: Payer: Self-pay | Admitting: Pharmacist

## 2022-02-27 DIAGNOSIS — B20 Human immunodeficiency virus [HIV] disease: Secondary | ICD-10-CM

## 2022-02-27 MED ORDER — BICTEGRAVIR-EMTRICITAB-TENOFOV 50-200-25 MG PO TABS: 1.0000 | ORAL_TABLET | Freq: Every day | ORAL | 0 refills | Status: AC

## 2022-02-27 NOTE — Progress Notes (Signed)
Medication Samples have been provided to the patient. ? ?Drug name: Biktarvy        ?Strength: 50/200/25 mg       ?Qty: 14 tablets (2 bottles) ?LOT: [id number]   ?Exp.Date: 10/24 ? ?Dosing instructions: Take one tablet by mouth once daily ? ?The patient has been instructed regarding the correct time, dose, and frequency of taking this medication, including desired effects and most common side effects.  ? ?Alfonse Spruce, PharmD, CPP ?Clinical Pharmacist Practitioner ?Infectious Diseases Clinical Pharmacist ?[place] [facility] ? ?

## 2022-03-07 ENCOUNTER — Encounter: Payer: Self-pay | Admitting: Internal Medicine

## 2022-03-13 ENCOUNTER — Other Ambulatory Visit: Payer: Self-pay | Admitting: Pharmacist

## 2022-03-13 DIAGNOSIS — B20 Human immunodeficiency virus [HIV] disease: Secondary | ICD-10-CM

## 2022-03-13 MED ORDER — BIKTARVY 50-200-25 MG PO TABS: 1.0000 | ORAL_TABLET | Freq: Every day | ORAL | 0 refills | Status: AC

## 2022-03-13 NOTE — Progress Notes (Signed)
Medication Samples have been provided to the patient.  Drug name: Biktarvy        Strength: 50/200/25 mg       Qty: 14 tablets (2 bottles)   LOT: [id number]   Exp.Date: 07/2024  Dosing instructions: Take one tablet by mouth once daily  The patient has been instructed regarding the correct time, dose, and frequency of taking this medication, including desired effects and most common side effects.   Bennetta Rudden L. Nikyah Lackman, PharmD, BCIDP, AAHIVP, CPP Clinical Pharmacist Practitioner Infectious Diseases Clinical Pharmacist [facility] 10/22/2020, 10:07 AM  

## 2022-03-27 ENCOUNTER — Other Ambulatory Visit: Payer: Self-pay | Admitting: Internal Medicine

## 2022-03-27 ENCOUNTER — Telehealth: Payer: Self-pay

## 2022-03-27 MED ORDER — VENLAFAXINE HCL ER 75 MG PO CP24: 75.0000 mg | ORAL_CAPSULE | Freq: Every day | ORAL | 2 refills | Status: DC

## 2022-03-27 NOTE — Telephone Encounter (Signed)
Attempted to call patient with update. Not able to reach him at this time.  Not able to leave voicemail. Leatrice Jewels, RMA

## 2022-03-27 NOTE — Telephone Encounter (Signed)
Patient called office today regarding Effexor prescription. States that he would like to know if provider is okay with starting  lower dose and titrating up to 150 mg.  States that he has not been on medication for a while and is concerned with restating at 150 mg.  Would like Rx sent to [facility]. Leatrice Jewels, RMA

## 2022-04-13 ENCOUNTER — Encounter: Payer: Self-pay | Admitting: Infectious Diseases

## 2022-09-17 ENCOUNTER — Encounter: Payer: Self-pay | Admitting: Internal Medicine

## 2022-09-17 ENCOUNTER — Other Ambulatory Visit (HOSPITAL_COMMUNITY): Payer: Self-pay

## 2022-09-18 ENCOUNTER — Other Ambulatory Visit: Payer: Self-pay

## 2022-09-18 ENCOUNTER — Other Ambulatory Visit (HOSPITAL_COMMUNITY): Payer: Self-pay

## 2022-09-18 MED ORDER — VENLAFAXINE HCL ER 150 MG PO CP24: 150.0000 mg | ORAL_CAPSULE | Freq: Every day | ORAL | 5 refills | Status: DC

## 2022-09-18 MED ORDER — VENLAFAXINE HCL ER 75 MG PO CP24
75.0000 mg | ORAL_CAPSULE | Freq: Every day | ORAL | 0 refills | Status: DC
  Filled 2022-09-18: qty 30, 30d supply, fill #0

## 2022-09-18 MED ORDER — ATORVASTATIN CALCIUM 10 MG PO TABS: 10.0000 mg | ORAL_TABLET | Freq: Every day | ORAL | 11 refills | Status: DC

## 2022-09-24 ENCOUNTER — Other Ambulatory Visit: Payer: Self-pay

## 2022-09-24 MED ORDER — BIKTARVY 50-200-25 MG PO TABS: 1.0000 | ORAL_TABLET | Freq: Every day | ORAL | 0 refills | Status: DC

## 2022-09-24 MED ORDER — VENLAFAXINE HCL ER 150 MG PO CP24
150.0000 mg | ORAL_CAPSULE | Freq: Every day | ORAL | 0 refills | Status: DC
Start: 2022-09-24 — End: 2022-11-05

## 2022-10-07 ENCOUNTER — Ambulatory Visit: Payer: Self-pay | Admitting: Internal Medicine

## 2022-10-07 NOTE — Progress Notes (Deleted)
[facility]   CHIEF COMPLAINT    HIV follow up.  ***  SUBJECTIVE:    Gary Lindsey is a 51 y.o. male with PMHx as below who presents to the clinic for HIV follow up.   Please see A&P for the details of today's visit and status of the patient's medical problems.   Patient's Medications  New Prescriptions   No medications on file  Previous Medications   ATORVASTATIN (LIPITOR) 10 MG TABLET    Take 1 tablet (10 mg total) by mouth at bedtime.   BIKTARVY 50-200-25 MG TABS TABLET    Take 1 tablet by mouth daily.   VENLAFAXINE XR (EFFEXOR-XR) 150 MG 24 HR CAPSULE    Take 1 capsule (150 mg total) by mouth daily.   VENLAFAXINE XR (EFFEXOR-XR) 75 MG 24 HR CAPSULE    Take 1 capsule (75 mg total) by mouth daily.   VENLAFAXINE XR (EFFEXOR-XR) 75 MG 24 HR CAPSULE    Take 1 capsule (75 mg total) by mouth daily.  Modified Medications   No medications on file  Discontinued Medications   No medications on file      Past Medical History:  Diagnosis Date   Anxiety    Basal cell carcinoma of back    Chest pain    Closed left ankle fracture 06-12-2014   plating   DEPRESSION    HERPES SIMPLEX INFECTION    HIV infection ([place])    INSOMNIA    Urethral discharge     Social History   Tobacco Use   Smoking status: Former   Smokeless tobacco: Never  Substance Use Topics   Alcohol use: Yes    Alcohol/week: 1.0 standard drink of alcohol    Types: 1 Standard drinks or equivalent per week    Comment: occ   Drug use: Yes    Types: Marijuana    Comment: occasional    Family History  Problem Relation Age of Onset   Cancer Mother        cervical   Hypertension Father    Hyperlipidemia Father     No Known Allergies  ROS   OBJECTIVE:    There were no vitals filed for this visit.   There is no height or weight on file to calculate BMI.  Physical Exam  Labs and Microbiology:    Latest Ref Rng & Units 11/28/2021    2:32 PM 11/18/2017    9:09 AM  01/28/2017   10:53 AM  CMP  Glucose 65 - 99 mg/dL 153  133  91   BUN 7 - 25 mg/dL _0 Creatinine 0.70 - 1.30 mg/dL 1.22  1.00  0.99   Sodium 135 - 146 mmol/L 140  138  139   Potassium 3.5 - 5.3 mmol/L 3.9  4.3  4.5   Chloride 98 - 110 mmol/L 104  103  102   CO2 20 - 32 mmol/L _1 Calcium 8.6 - 10.3 mg/dL 9.8  9.5  9.4   Total Protein 6.1 - 8.1 g/dL 7.9  7.7  7.4   Total Bilirubin 0.2 - 1.2 mg/dL 0.6  0.5  0.4   Alkaline Phos 40 - 115 U/L   60   AST 10 - 35 U/L _2 ALT 9 - 46 U/L _3 Latest Ref  Rng & Units 11/28/2021    2:32 PM 11/18/2017    9:09 AM 01/28/2017   10:53 AM  CBC  WBC 3.8 - 10.8 Thousand/uL 9.3  8.1  5.7   Hemoglobin 13.2 - 17.1 g/dL 15.2  14.6  14.7   Hematocrit 38.5 - 50.0 % 44.3  41.4  43.4   Platelets 140 - 400 Thousand/uL 295  228  240      Lab Results  Component Value Date   HIV1RNAQUANT Not Detected 11/28/2021   HIV1RNAQUANT <20 DETECTED (A) 11/18/2017   HIV1RNAQUANT 148 (H) 01/28/2017   CD4TABS 776 11/28/2021   CD4TABS 430 11/18/2017   CD4TABS 780 01/28/2017    RPR and STI: Lab Results  Component Value Date   LABRPR NON-REACTIVE 11/28/2021   LABRPR NON-REACTIVE 11/18/2017   LABRPR NON REAC 01/28/2017   LABRPR NON REAC 04/14/2016   LABRPR NON REAC 08/01/2015    STI Results CT  Latest Ref Rng & Units NEGATIVE  09/17/2010 11:08 PM NEGATIVE     Hepatitis B: Lab Results  Component Value Date   HEPBSAB POS (A) 09/17/2010   HEPBSAG NEG 09/17/2010   HEPBCAB NEG 09/17/2010   Hepatitis C: No results found for: "HEPCAB", "HCVRNAPCRQN" Hepatitis A: Lab Results  Component Value Date   HAV POS (A) 09/17/2010   Lipids: Lab Results  Component Value Date   CHOL 229 (H) 11/28/2021   TRIG 209 (H) 11/28/2021   HDL 34 (L) 11/28/2021   CHOLHDL 6.7 (H) 11/28/2021   VLDL 62 (H) 01/28/2017   LDLCALC 158 (H) 11/28/2021    Imaging: ***   ASSESSMENT & PLAN:    No problem-specific Assessment & Plan notes  found for this encounter.   No orders of the defined types were placed in this encounter.      *** Vaccines Influenza: give every year COVID: recommend vaccination if not already done Prevnar 20: Give x 1 if no prior pneumonia vaccine.    - If only 1 dose of either PPSV 23 OR PCV 13 ----> give PCV 20 if > 1 year since last vaccine  - If received both PPSV 23 AND PCV 13 ----> give PCV 20 if > 5 years since last vaccine.  If < 5 years then wait to give PCV 20 Pneumovax-23: (if CD4 >200) give twice every 5 years apart before age 102, then once at age 89.  Give >8 weeks from [place] Prevnar-13: (preferably when CD4 >200) give once, give >1 year from last Pneumovax-23 Hepatitis A: give Havrix 2 dose series at 0 and 6-12 months if non-immune Hepatitis B: give Heplisav 2 dose series at 0 and 4 weeks if non-immune.  Repeat serology 2 months after vaccine and revaccinate if needed MenACWY: 2 dose primary series 8 weeks apart, then 1 dose booster every 5 years HPV: Gardasil-9 at 0, 2, and 6 months for ages 9-26 should be vaccinated.  Ages 80-45 should be offered if appropriate Tdap: give every 10 years Shingles: give Shingrix 2 dose series at 0 and 2-6 months if >50 years on ART with CD4 cell count >200 Varicella: primary vaccination may be considered in VZV seronegative persons aged >8 years (if CD4 >200)  MMR: vaccine should be given if born in [birth year] or after and do not have immunity (if CD4 >200)  Screening DEXA Scan: if age >55 Quantiferon: check at initiation of care Hepatitis C: check at initiation of care.  Screen annually if risk factors HLA B5701: check at initiation of care  G6PD: check if starting therapy with oxidant drugs Lipids: check annually Urinalysis: check annually or every 6 months if on tenofovir Hgb A1c: check annually  ASCVD Risk Score Consider high-intensity statin therapy if 10-year ASCVD risk score >7.5% The 10-year ASCVD risk score (Arnett DK, et al., 2019) is:  6.6%   [facility] 10/07/2022, 5:48 AM  HIV: Here for routine HIV follow up and last seen in January 2023.  He is adherent to [facility] with no issues tolerating or accessing his medication.  He has good long term control and will repeat labs today.    HLD: He is on Atorvastatin 60m daily.  Check lipids today.   STI/Screening: Screening offered and he ***.  Anxiety: Currently on venlafexine 1524mdaily and will continue.  Refills sent today.   Anal warts: [facility]  [facility] BoAntionette Charnd was last seen in October 2019 at which time he was noted to have anal canal and perianal warts.  Cytology at that time showed low-grade squamous epithelial lesions.  He declines referral back to [facility] today and states he has no current issues/concerns.

## 2022-10-19 ENCOUNTER — Other Ambulatory Visit: Payer: Self-pay | Admitting: Internal Medicine

## 2022-10-20 ENCOUNTER — Other Ambulatory Visit: Payer: Self-pay

## 2022-10-20 MED ORDER — BIKTARVY 50-200-25 MG PO TABS: 1.0000 | ORAL_TABLET | Freq: Every day | ORAL | 0 refills | Status: DC

## 2022-10-20 NOTE — Telephone Encounter (Signed)
Unable to reach pt, needs to schedule appt for refills

## 2022-11-05 ENCOUNTER — Encounter: Payer: Self-pay | Admitting: Family

## 2022-11-05 ENCOUNTER — Ambulatory Visit (INDEPENDENT_AMBULATORY_CARE_PROVIDER_SITE_OTHER): Payer: Self-pay | Admitting: Family

## 2022-11-05 ENCOUNTER — Other Ambulatory Visit: Payer: Self-pay

## 2022-11-05 VITALS — BP 124/82 | HR 69 | Temp 98.0°F | Ht 68.0 in | Wt 190.0 lb

## 2022-11-05 DIAGNOSIS — B20 Human immunodeficiency virus [HIV] disease: Secondary | ICD-10-CM

## 2022-11-05 DIAGNOSIS — F331 Major depressive disorder, recurrent, moderate: Secondary | ICD-10-CM

## 2022-11-05 DIAGNOSIS — E785 Hyperlipidemia, unspecified: Secondary | ICD-10-CM

## 2022-11-05 DIAGNOSIS — Z113 Encounter for screening for infections with a predominantly sexual mode of transmission: Secondary | ICD-10-CM

## 2022-11-05 DIAGNOSIS — Z0000 Encounter for general adult medical examination without abnormal findings: Secondary | ICD-10-CM | POA: Insufficient documentation

## 2022-11-05 MED ORDER — VENLAFAXINE HCL ER 150 MG PO CP24: 150.0000 mg | ORAL_CAPSULE | Freq: Every day | ORAL | 6 refills | Status: DC

## 2022-11-05 MED ORDER — ATORVASTATIN CALCIUM 10 MG PO TABS: 10.0000 mg | ORAL_TABLET | Freq: Every day | ORAL | 6 refills | Status: DC

## 2022-11-05 MED ORDER — BIKTARVY 50-200-25 MG PO TABS: 1.0000 | ORAL_TABLET | Freq: Every day | ORAL | 6 refills | Status: DC

## 2022-11-05 NOTE — Assessment & Plan Note (Signed)
Keghan continues to have well controlled virus with good adherence and tolerance to [employer]. Reviewed previous lab work and discussed plan of care. Financial assistance up to date and may have insurance later in the year as he is now back to work. Check lab work. Continue current dose of Biktarvy. Plan for follow up in 6 months or sooner if needed with lab work on the same day.

## 2022-11-05 NOTE — Progress Notes (Signed)
Brief Narrative   Patient ID: Gary Lindsey, male    DOB: 07-20-1971, 51 y.o.   MRN: [id number]    Subjective:    Chief Complaint  Patient presents with   Follow-up    HPI:  Gary Lindsey is a 51 y.o. male with HIV disease last seen on 11/28/21 by Dr. Juleen China for routine follow up with well controlled virus and good adherence and tolerance to [place]. Viral load was undetectable and CD4 count 776. Renal function, hepatic function and electrolytes within normal ranges. RPR non-reactive for syphilis. Blood glucose was elevated at 153. Lipid profile with the LDL 158, HDL 34 and triglycerides 209. Here today for routine follow up.  Gary Lindsey has been doing well since his last office visit. Now back to work in [place] and is no longer pursuing disability and he is happier now. Continues to take the venlafaxine which helps with this. Has been taking his Biktarvy and atorvastatin with no adverse side effects and no problems obtaining medication from the pharmacy. May have insurance in the near future. Condoms and STD testing offered. Declines vaccines.   Denies fevers, chills, night sweats, headaches, changes in vision, neck pain/stiffness, nausea, diarrhea, vomiting, lesions or rashes.   No Known Allergies    Outpatient Medications Prior to Visit  Medication Sig Dispense Refill   atorvastatin (LIPITOR) 10 MG tablet Take 1 tablet (10 mg total) by mouth at bedtime. 30 tablet 11   BIKTARVY 50-200-25 MG TABS tablet Take 1 tablet by mouth daily. 30 tablet 0   venlafaxine XR (EFFEXOR-XR) 150 MG 24 hr capsule Take 1 capsule (150 mg total) by mouth daily. 30 capsule 0   venlafaxine XR (EFFEXOR-XR) 75 MG 24 hr capsule Take 1 capsule (75 mg total) by mouth daily. 30 capsule 2   venlafaxine XR (EFFEXOR-XR) 75 MG 24 hr capsule Take 1 capsule (75 mg total) by mouth daily. 30 capsule 0   No facility-administered medications prior to visit.     Past Medical History:  Diagnosis Date   Anxiety    Basal  cell carcinoma of back    Chest pain    Closed left ankle fracture 06-12-2014   plating   DEPRESSION    HERPES SIMPLEX INFECTION    HIV infection ([place])    INSOMNIA    Urethral discharge      History reviewed. No pertinent surgical history.    Review of Systems  Constitutional:  Negative for appetite change, chills, fatigue, fever and unexpected weight change.  Eyes:  Negative for visual disturbance.  Respiratory:  Negative for cough, chest tightness, shortness of breath and wheezing.   Cardiovascular:  Negative for chest pain and leg swelling.  Gastrointestinal:  Negative for abdominal pain, constipation, diarrhea, nausea and vomiting.  Genitourinary:  Negative for dysuria, flank pain, frequency, genital sores, hematuria and urgency.  Skin:  Negative for rash.  Allergic/Immunologic: Negative for immunocompromised state.  Neurological:  Negative for dizziness and headaches.      Objective:    BP 124/82   Pulse 69   Temp 98 F (36.7 C) (Temporal)   Ht '5\' 8"'$  (1.727 m)   Wt 190 lb (86.2 kg)   SpO2 98%   BMI 28.89 kg/m  Nursing note and vital signs reviewed.  Physical Exam Constitutional:      General: He is not in acute distress.    Appearance: He is well-developed.  Eyes:     Conjunctiva/sclera: Conjunctivae normal.  Cardiovascular:     Rate and  Rhythm: Normal rate and regular rhythm.     Heart sounds: Normal heart sounds. No murmur heard.    No friction rub. No gallop.  Pulmonary:     Effort: Pulmonary effort is normal. No respiratory distress.     Breath sounds: Normal breath sounds. No wheezing or rales.  Chest:     Chest wall: No tenderness.  Abdominal:     General: Bowel sounds are normal.     Palpations: Abdomen is soft.     Tenderness: There is no abdominal tenderness.  Musculoskeletal:     Cervical back: Neck supple.  Lymphadenopathy:     Cervical: No cervical adenopathy.  Skin:    General: Skin is warm and dry.     Findings: No rash.   Neurological:     Mental Status: He is alert and oriented to person, place, and time.  Psychiatric:        Behavior: Behavior normal.        Thought Content: Thought content normal.        Judgment: Judgment normal.         11/05/2022    8:47 AM 11/28/2021    2:07 PM 11/18/2017    8:40 AM 01/28/2017    9:58 AM 10/15/2016   11:15 AM  Depression screen PHQ 2/9  Decreased Interest 0 0 0 0 0  Down, Depressed, Hopeless 0 0 0 0 0  PHQ - 2 Score 0 0 0 0 0       Assessment & Plan:    Patient Active Problem List   Diagnosis Date Noted   Healthcare maintenance 11/05/2022   Hyperlipidemia 11/28/2021   Hepatitis B immune 01/28/2017   Closed left ankle fracture 06/12/2014   Anal warts 10/28/2012   Anxiety 05/17/2012   Chest pain 05/17/2012   Basal cell carcinoma of back 03/12/2011   Human immunodeficiency virus (HIV) disease ([place]) 09/17/2010   Herpes simplex virus (HSV) infection 09/17/2010   Depression 09/17/2010   INSOMNIA 09/17/2010   URETHRAL DISCHARGE 09/17/2010     Problem List Items Addressed This Visit       Other   Human immunodeficiency virus (HIV) disease ([place]) - Primary    Gary Lindsey continues to have well controlled virus with good adherence and tolerance to [employer]. Reviewed previous lab work and discussed plan of care. Financial assistance up to date and may have insurance later in the year as he is now back to work. Check lab work. Continue current dose of Biktarvy. Plan for follow up in 6 months or sooner if needed with lab work on the same day.       Relevant Medications   BIKTARVY 50-200-25 MG TABS tablet   Other Relevant Orders   COMPLETE METABOLIC PANEL WITH GFR   HIV-1 RNA quant-no reflex-bld   T-helper cell (CD4)- ([facility] clinic only)   Depression    Gary Lindsey's depression is well controlled and feels good with venlafaxine with no adverse side effects. No suicidal ideations or signs of psychosis. Continue current dose of venlafaxine.       Relevant  Medications   venlafaxine XR (EFFEXOR-XR) 150 MG 24 hr capsule   Hyperlipidemia    Gary Lindsey has good adherence and tolerance to atorvastatin with no adverse side effects or myalgias. Check lab work for therapeutic drug monitoring. Most recent ASCVD risk of 5.9%. Continue current dose of atorvastatin.       Relevant Medications   atorvastatin (LIPITOR) 10 MG tablet   Other Relevant Orders  Lipid Profile   Healthcare maintenance    Discussed importance of safe sexual practice and condom use. Condoms and STD testing offered.  Declines vaccinations.       Other Visit Diagnoses     Screening for STDs (sexually transmitted diseases)       Relevant Orders   RPR        I am having Gary Lindsey maintain his atorvastatin, Biktarvy, and venlafaxine XR.   Meds ordered this encounter  Medications   atorvastatin (LIPITOR) 10 MG tablet    Sig: Take 1 tablet (10 mg total) by mouth at bedtime.    Dispense:  30 tablet    Refill:  6    Order Specific Question:   Supervising Provider    Answer:   Baxter Flattery, CYNTHIA [[id number]]   BIKTARVY 50-200-25 MG TABS tablet    Sig: Take 1 tablet by mouth daily.    Dispense:  30 tablet    Refill:  6    Order Specific Question:   Supervising Provider    Answer:   Carlyle Basques [[id number]]   venlafaxine XR (EFFEXOR-XR) 150 MG 24 hr capsule    Sig: Take 1 capsule (150 mg total) by mouth daily.    Dispense:  30 capsule    Refill:  6    Order Specific Question:   Supervising Provider    Answer:   Carlyle Basques [[id number]]     Follow-up: Return in about 6 months (around 05/07/2023), or if symptoms worsen or fail to improve.   Terri Piedra, MSN, FNP-C Nurse Practitioner [facility] [place] number: [phone]

## 2022-11-05 NOTE — Assessment & Plan Note (Signed)
Gary Lindsey has good adherence and tolerance to atorvastatin with no adverse side effects or myalgias. Check lab work for therapeutic drug monitoring. Most recent ASCVD risk of 5.9%. Continue current dose of atorvastatin.

## 2022-11-05 NOTE — Assessment & Plan Note (Signed)
Discussed importance of safe sexual practice and condom use. Condoms and STD testing offered.  Declines vaccinations.  

## 2022-11-05 NOTE — Assessment & Plan Note (Signed)
Gary Lindsey's depression is well controlled and feels good with venlafaxine with no adverse side effects. No suicidal ideations or signs of psychosis. Continue current dose of venlafaxine.

## 2022-11-05 NOTE — Patient Instructions (Addendum)
Nice to see you. ? ?We will check your lab work today. ? ?Continue to take your medication daily as prescribed. ? ?Refills have been sent to the pharmacy. ? ?Plan for follow up in 6 months or sooner if needed with lab work on the same day. ? ?Have a great day and stay safe! ? ?

## 2022-11-06 LAB — T-HELPER CELL (CD4) - (RCID CLINIC ONLY)
CD4 % Helper T Cell: 53 % (ref 33–65)
CD4 T Cell Abs: 887 /uL (ref 400–1790)

## 2022-11-08 LAB — LIPID PANEL
Cholesterol: 194 mg/dL (ref <200)
HDL: 42 mg/dL (ref >=40)
LDL Cholesterol (Calc): 122 mg/dL (calc) — ABNORMAL HIGH
Non-HDL Cholesterol (Calc): 152 mg/dL (calc) — ABNORMAL HIGH (ref <130)
Total CHOL/HDL Ratio: 4.6 (calc) (ref <5.0)
Triglycerides: 187 mg/dL — ABNORMAL HIGH (ref <150)

## 2022-11-08 LAB — COMPLETE METABOLIC PANEL WITH GFR
AG Ratio: 1.6 (calc) (ref 1.0–2.5)
ALT: 15 U/L (ref 9–46)
AST: 15 U/L (ref 10–35)
Albumin: 4.3 g/dL (ref 3.6–5.1)
Alkaline phosphatase (APISO): 74 U/L (ref 35–144)
BUN: 15 mg/dL (ref 7–25)
CO2: 27 mmol/L (ref 20–32)
Calcium: 9.4 mg/dL (ref 8.6–10.3)
Chloride: 106 mmol/L (ref 98–110)
Creat: 0.94 mg/dL (ref 0.70–1.30)
Globulin: 2.7 g/dL (calc) (ref 1.9–3.7)
Glucose, Bld: 123 mg/dL — ABNORMAL HIGH (ref 65–99)
Potassium: 4.6 mmol/L (ref 3.5–5.3)
Sodium: 140 mmol/L (ref 135–146)
Total Bilirubin: 0.4 mg/dL (ref 0.2–1.2)
Total Protein: 7 g/dL (ref 6.1–8.1)
eGFR: 99 mL/min/{1.73_m2} (ref >=60)

## 2022-11-08 LAB — HIV-1 RNA QUANT-NO REFLEX-BLD
HIV 1 RNA Quant: NOT DETECTED Copies/mL
HIV-1 RNA Quant, Log: NOT DETECTED Log cps/mL

## 2022-11-08 LAB — RPR: RPR Ser Ql: NONREACTIVE

## 2023-01-15 ENCOUNTER — Other Ambulatory Visit (HOSPITAL_COMMUNITY): Payer: Self-pay

## 2023-03-12 ENCOUNTER — Telehealth: Payer: Self-pay

## 2023-03-12 NOTE — Telephone Encounter (Signed)
Called pt left message asking them to come apply for Medicaid.

## 2023-04-30 ENCOUNTER — Ambulatory Visit: Payer: Self-pay | Admitting: Family

## 2023-05-19 ENCOUNTER — Ambulatory Visit: Payer: Self-pay

## 2023-05-19 ENCOUNTER — Ambulatory Visit: Payer: Self-pay | Admitting: Family

## 2023-06-10 ENCOUNTER — Other Ambulatory Visit: Payer: Self-pay | Admitting: Family

## 2023-06-10 DIAGNOSIS — B20 Human immunodeficiency virus [HIV] disease: Secondary | ICD-10-CM

## 2023-06-10 DIAGNOSIS — E785 Hyperlipidemia, unspecified: Secondary | ICD-10-CM

## 2023-06-10 DIAGNOSIS — F331 Major depressive disorder, recurrent, moderate: Secondary | ICD-10-CM

## 2023-06-10 MED ORDER — BIKTARVY 50-200-25 MG PO TABS: 1.0000 | ORAL_TABLET | Freq: Every day | ORAL | 0 refills | Status: DC

## 2023-06-23 ENCOUNTER — Ambulatory Visit: Payer: Self-pay

## 2023-06-23 ENCOUNTER — Encounter: Payer: Self-pay | Admitting: Family

## 2023-06-23 ENCOUNTER — Ambulatory Visit (INDEPENDENT_AMBULATORY_CARE_PROVIDER_SITE_OTHER): Payer: Self-pay | Admitting: Family

## 2023-06-23 ENCOUNTER — Other Ambulatory Visit: Payer: Self-pay

## 2023-06-23 VITALS — BP 124/85 | HR 99 | Temp 98.3°F | Ht 68.0 in | Wt 196.0 lb

## 2023-06-23 DIAGNOSIS — Z0000 Encounter for general adult medical examination without abnormal findings: Secondary | ICD-10-CM

## 2023-06-23 DIAGNOSIS — F39 Unspecified mood [affective] disorder: Secondary | ICD-10-CM

## 2023-06-23 DIAGNOSIS — F331 Major depressive disorder, recurrent, moderate: Secondary | ICD-10-CM | POA: Diagnosis not present

## 2023-06-23 DIAGNOSIS — B20 Human immunodeficiency virus [HIV] disease: Secondary | ICD-10-CM

## 2023-06-23 MED ORDER — ARIPIPRAZOLE 5 MG PO TABS: 5.0000 mg | ORAL_TABLET | Freq: Every day | ORAL | 2 refills | Status: DC

## 2023-06-23 NOTE — Patient Instructions (Addendum)
Nice to see you.  We will check your lab work today.  Continue to take your medication daily as prescribed.  Refills have been sent to the pharmacy.  Plan for follow up in 1 months or sooner if needed with lab work on the same day.  Have a great day and stay safe!  

## 2023-06-23 NOTE — Progress Notes (Unsigned)
Brief Narrative   Patient ID: Gary Lindsey, male    DOB: [birth year], 52 y.o.   MRN: [id number]  Gary Lindsey is a 52 y/o AA male diagnosed with HIV in 1996 with risk factor of IVDU. Initial viral load was 3.5 million with CD4 count 42. No genosure available. Entered care at [facility] 3.  History of thrush. ART experienced with darunivir/ritonivir/truvada; Kaletra, Symtuza and Biktarvy.   Subjective:    No chief complaint on file.   HPI:  Gary Lindsey is a 52 y.o. male with HIV disease last seen on 11/05/22 with well controlled virus and good adherence and tolerance to [employer]. Viral load was undetectable and CD4 count 887. RPR was non-reactive. Renal function, hepatic function and electrolytes within normal ranges. Lipid profile with HDL 42, LDL 122 and Triglycerides 782. Was seen for back pain as part of worker's compensation. Here today for follow up.  Deylan has been doing okay and continues to work his way through the back pain. Currently on Effexor which does help with his mood and has questions and about adding Abilify    No Known Allergies    Outpatient Medications Prior to Visit  Medication Sig Dispense Refill   atorvastatin (LIPITOR) 10 MG tablet TAKE 1 TABLET(10 MG) BY MOUTH AT BEDTIME 30 tablet 0   BIKTARVY 50-200-25 MG TABS tablet Take 1 tablet by mouth daily. 30 tablet 0   venlafaxine XR (EFFEXOR-XR) 150 MG 24 hr capsule TAKE 1 CAPSULE(150 MG) BY MOUTH DAILY 30 capsule 0   No facility-administered medications prior to visit.     Past Medical History:  Diagnosis Date   Anxiety    Basal cell carcinoma of back    Chest pain    Closed left ankle fracture 06-12-2014   plating   DEPRESSION    HERPES SIMPLEX INFECTION    HIV infection ([facility])    INSOMNIA    Urethral discharge      No past surgical history on file.    Review of Systems    Objective:    There were no vitals taken for this visit. Nursing note and vital signs reviewed.  Physical Exam       11/05/2022    8:47 AM 11/28/2021    2:07 PM 11/18/2017    8:40 AM 01/28/2017    9:58 AM 10/15/2016   11:15 AM  Depression screen PHQ 2/9  Decreased Interest 0 0 0 0 0  Down, Depressed, Hopeless 0 0 0 0 0  PHQ - 2 Score 0 0 0 0 0       Assessment & Plan:    Patient Active Problem List   Diagnosis Date Noted   Healthcare maintenance 11/05/2022   Hyperlipidemia 11/28/2021   Hepatitis B immune 01/28/2017   Closed left ankle fracture 06/12/2014   Anal warts 10/28/2012   Anxiety 05/17/2012   Chest pain 05/17/2012   Basal cell carcinoma of back 03/12/2011   Human immunodeficiency virus (HIV) disease ([facility]) 09/17/2010   Herpes simplex virus (HSV) infection 09/17/2010   Depression 09/17/2010   INSOMNIA 09/17/2010   URETHRAL DISCHARGE 09/17/2010     Problem List Items Addressed This Visit   None    I am having [name] maintain his venlafaxine XR, atorvastatin, and Biktarvy.   No orders of the defined types were placed in this encounter.    Follow-up: No follow-ups on file.   Marcos Eke, MSN, FNP-C Nurse Practitioner [facility]  [facility] Main number: [phone]

## 2023-06-24 ENCOUNTER — Encounter: Payer: Self-pay | Admitting: Family

## 2023-06-24 DIAGNOSIS — F39 Unspecified mood [affective] disorder: Secondary | ICD-10-CM | POA: Insufficient documentation

## 2023-06-24 MED ORDER — BIKTARVY 50-200-25 MG PO TABS
1.0000 | ORAL_TABLET | Freq: Every day | ORAL | 4 refills | Status: DC
Start: 2023-06-24 — End: 2023-12-17

## 2023-06-24 NOTE — Assessment & Plan Note (Addendum)
Robinson's depression and anxiety appears adequately controlled with current dose of Effexor with no suicidal ideations or signs of psychosis. Based on discussion may have undiagnosed mood disorder that is contributing to his symptoms. Referral sent to Psychiatry for further evaluation. Discussed addition of Seroquel and he has previously taken that and given him a hangover type feeling. Will start low dose Abilify and see that stabilizes his mood. Not currently covered by [employer] and GoodRx coupon provided. Continue current dose of Effexor. Plan for follow up in 1 month or sooner if needed.

## 2023-06-24 NOTE — Assessment & Plan Note (Signed)
Godson continues to have well controlled virus with good adherence and tolerance to [employer]. Reviewed previous lab work and discussed plan of care and U equals U. Renew financial assistance/apply for Medicaid. Check lab work. Continue current dose of Biktarvy. Plan for follow up in 1 month or sooner if needed with lab work on the same day.

## 2023-06-24 NOTE — Assessment & Plan Note (Signed)
Discussed importance of safe sexual practice and condom use. Condoms and STD testing offered.  Due for colon cancer screening and await to see if he is approved for Medicaid or will need [place] financial assistance.  Routine dental care up to date.

## 2023-07-10 ENCOUNTER — Other Ambulatory Visit: Payer: Self-pay | Admitting: Family

## 2023-07-10 DIAGNOSIS — E785 Hyperlipidemia, unspecified: Secondary | ICD-10-CM

## 2023-07-10 DIAGNOSIS — F331 Major depressive disorder, recurrent, moderate: Secondary | ICD-10-CM

## 2023-07-24 ENCOUNTER — Encounter: Payer: Self-pay | Admitting: Family

## 2023-07-24 ENCOUNTER — Ambulatory Visit (INDEPENDENT_AMBULATORY_CARE_PROVIDER_SITE_OTHER): Payer: Self-pay | Admitting: Family

## 2023-07-24 ENCOUNTER — Other Ambulatory Visit: Payer: Self-pay

## 2023-07-24 VITALS — BP 132/89 | HR 74 | Temp 98.0°F | Ht 68.0 in | Wt 200.0 lb

## 2023-07-24 DIAGNOSIS — E785 Hyperlipidemia, unspecified: Secondary | ICD-10-CM

## 2023-07-24 DIAGNOSIS — F32A Depression, unspecified: Secondary | ICD-10-CM

## 2023-07-24 DIAGNOSIS — F331 Major depressive disorder, recurrent, moderate: Secondary | ICD-10-CM

## 2023-07-24 DIAGNOSIS — F39 Unspecified mood [affective] disorder: Secondary | ICD-10-CM

## 2023-07-24 DIAGNOSIS — B20 Human immunodeficiency virus [HIV] disease: Secondary | ICD-10-CM

## 2023-07-24 MED ORDER — VENLAFAXINE HCL ER 150 MG PO CP24
150.0000 mg | ORAL_CAPSULE | Freq: Every day | ORAL | 5 refills | Status: DC
Start: 2023-07-24 — End: 2024-01-04

## 2023-07-24 MED ORDER — ATORVASTATIN CALCIUM 10 MG PO TABS
10.0000 mg | ORAL_TABLET | Freq: Every day | ORAL | 5 refills | Status: DC
Start: 2023-07-24 — End: 2024-01-04

## 2023-07-24 MED ORDER — ARIPIPRAZOLE 10 MG PO TABS: 10.0000 mg | ORAL_TABLET | Freq: Every day | ORAL | 5 refills | Status: DC

## 2023-07-24 NOTE — Progress Notes (Unsigned)
Brief Narrative   Patient ID: Gary Lindsey, male    DOB: 1971-06-15, 52 y.o.   MRN: [id number]  Gary Lindsey is a 52 y/o AA male diagnosed with HIV in 1996 with risk factor of IVDU. Initial viral load was 3.5 million with CD4 count 42. No genosure available. Entered care at [facility] Stage 3. History of thrush. ART experienced with darunivir/ritonivir/truvada; Kaletra, Symtuza and Biktarvy.   Subjective:    No chief complaint on file.   HPI:  Gary Lindsey is a 52 y.o. male with HIV disease last seen on 06/23/23 with well controlled virus and good adherence and tolerance to [employer]. Viral load was undetectable and CD4 count 991. Kidney function, liver function and electrolytes within normal ranges. Had concern for mood disorder in addition to his depression and was started on Abilify with his current dose of effexor with referral sent to Psychiatry for medication optimization. Sent message on 06/30/23 with improvement in his mood. Here today for follow up.       Denies fevers, chills, night sweats, headaches, changes in vision, neck pain/stiffness, nausea, diarrhea, vomiting, lesions or rashes.  Lab Results  Component Value Date   CD4TCELL 49 06/23/2023   CD4TABS 887 11/05/2022   Lab Results  Component Value Date   HIV1RNAQUANT Not Detected 06/23/2023     No Known Allergies    Outpatient Medications Prior to Visit  Medication Sig Dispense Refill   ARIPiprazole (ABILIFY) 5 MG tablet Take 1 tablet (5 mg total) by mouth daily. 30 tablet 2   atorvastatin (LIPITOR) 10 MG tablet TAKE 1 TABLET(10 MG) BY MOUTH AT BEDTIME 30 tablet 0   BIKTARVY 50-200-25 MG TABS tablet Take 1 tablet by mouth daily. 30 tablet 4   venlafaxine XR (EFFEXOR-XR) 150 MG 24 hr capsule TAKE 1 CAPSULE(150 MG) BY MOUTH DAILY 30 capsule 0   No facility-administered medications prior to visit.     Past Medical History:  Diagnosis Date   Anxiety    Basal cell carcinoma of back    Chest pain    Closed left ankle  fracture 06-12-2014   plating   DEPRESSION    HERPES SIMPLEX INFECTION    HIV infection (HCC)    INSOMNIA    Urethral discharge      No past surgical history on file.    Review of Systems  Constitutional:  Negative for appetite change, chills, fatigue, fever and unexpected weight change.  Eyes:  Negative for visual disturbance.  Respiratory:  Negative for cough, chest tightness, shortness of breath and wheezing.   Cardiovascular:  Negative for chest pain and leg swelling.  Gastrointestinal:  Negative for abdominal pain, constipation, diarrhea, nausea and vomiting.  Genitourinary:  Negative for dysuria, flank pain, frequency, genital sores, hematuria and urgency.  Skin:  Negative for rash.  Allergic/Immunologic: Negative for immunocompromised state.  Neurological:  Negative for dizziness and headaches.      Objective:    There were no vitals taken for this visit. Nursing note and vital signs reviewed.  Physical Exam Constitutional:      General: He is not in acute distress.    Appearance: He is well-developed.  Eyes:     Conjunctiva/sclera: Conjunctivae normal.  Cardiovascular:     Rate and Rhythm: Normal rate and regular rhythm.     Heart sounds: Normal heart sounds. No murmur heard.    No friction rub. No gallop.  Pulmonary:     Effort: Pulmonary effort is normal. No respiratory distress.  Breath sounds: Normal breath sounds. No wheezing or rales.  Chest:     Chest wall: No tenderness.  Abdominal:     General: Bowel sounds are normal.     Palpations: Abdomen is soft.     Tenderness: There is no abdominal tenderness.  Musculoskeletal:     Cervical back: Neck supple.  Lymphadenopathy:     Cervical: No cervical adenopathy.  Skin:    General: Skin is warm and dry.     Findings: No rash.  Neurological:     Mental Status: He is alert and oriented to person, place, and time.  Psychiatric:        Behavior: Behavior normal.        Thought Content: Thought content  normal.        Judgment: Judgment normal.         11/05/2022    8:47 AM 11/28/2021    2:07 PM 11/18/2017    8:40 AM 01/28/2017    9:58 AM 10/15/2016   11:15 AM  Depression screen PHQ 2/9  Decreased Interest 0 0 0 0 0  Down, Depressed, Hopeless 0 0 0 0 0  PHQ - 2 Score 0 0 0 0 0       Assessment & Plan:    Patient Active Problem List   Diagnosis Date Noted   Mood disorder (HCC) 06/24/2023   Healthcare maintenance 11/05/2022   Hyperlipidemia 11/28/2021   Hepatitis B immune 01/28/2017   Closed left ankle fracture 06/12/2014   Anal warts 10/28/2012   Anxiety 05/17/2012   Chest pain 05/17/2012   Basal cell carcinoma of back 03/12/2011   Human immunodeficiency virus (HIV) disease (HCC) 09/17/2010   Herpes simplex virus (HSV) infection 09/17/2010   Depression 09/17/2010   INSOMNIA 09/17/2010   URETHRAL DISCHARGE 09/17/2010     Problem List Items Addressed This Visit   None    I am having Gary Lindsey maintain his ARIPiprazole, Biktarvy, venlafaxine XR, and atorvastatin.   No orders of the defined types were placed in this encounter.    Follow-up: No follow-ups on file. or sooner if needed.    Gary Eke, MSN, FNP-C Nurse Practitioner [facility] Main number: [phone]

## 2023-07-24 NOTE — Patient Instructions (Addendum)
Nice to see you.  We will check your lab work today.  Continue to take your medication daily as prescribed.  Refills have been sent to the pharmacy.  Plan for follow up in 4 months or sooner if needed with lab work on the same day.  Have a great day and stay safe!

## 2023-07-25 ENCOUNTER — Encounter: Payer: Self-pay | Admitting: Family

## 2023-07-25 NOTE — Assessment & Plan Note (Signed)
Hulan's mood has stabilized with the addition of apiprazole in addition his venlafaxine and discussed increasing medication to 10 mg daily. Will continue current dose of venlafaxine and increase apiprazole to 10 mg daily. Has cancelled referral to Psychiatry at this point as he feels stable.

## 2023-07-25 NOTE — Assessment & Plan Note (Signed)
Gary Lindsey continues to have well controlled virus with good adherence and tolerance to [employer].  Reviewed lab work and discussed plan of care, U equals U.  Continue current dose of Biktarvy. Plan for follow up in  4 months or sooner if needed with lab work on the same day.

## 2023-08-14 ENCOUNTER — Telehealth (HOSPITAL_COMMUNITY): Payer: Self-pay | Admitting: Psychiatry

## 2023-12-09 ENCOUNTER — Telehealth: Payer: Self-pay

## 2023-12-09 NOTE — Telephone Encounter (Signed)
Patient requested a refill on effexor while rescheduling his appointment on 2/19. Patient wanted to see if provider can send in higher dose of effexor as he feels like the medication isn't really working. Please advise.   Gary Lindsey Lesli Albee, CMA

## 2023-12-17 ENCOUNTER — Other Ambulatory Visit: Payer: Self-pay | Admitting: Family

## 2023-12-17 DIAGNOSIS — B20 Human immunodeficiency virus [HIV] disease: Secondary | ICD-10-CM

## 2023-12-30 ENCOUNTER — Ambulatory Visit: Payer: BLUE CROSS/BLUE SHIELD | Admitting: Family

## 2024-01-04 ENCOUNTER — Ambulatory Visit (INDEPENDENT_AMBULATORY_CARE_PROVIDER_SITE_OTHER): Payer: Self-pay | Admitting: Family

## 2024-01-04 ENCOUNTER — Encounter: Payer: Self-pay | Admitting: Family

## 2024-01-04 ENCOUNTER — Other Ambulatory Visit: Payer: Self-pay

## 2024-01-04 VITALS — BP 144/89 | HR 73 | Temp 99.6°F | Ht 68.0 in | Wt 215.0 lb

## 2024-01-04 DIAGNOSIS — Z0000 Encounter for general adult medical examination without abnormal findings: Secondary | ICD-10-CM

## 2024-01-04 DIAGNOSIS — E785 Hyperlipidemia, unspecified: Secondary | ICD-10-CM

## 2024-01-04 DIAGNOSIS — F39 Unspecified mood [affective] disorder: Secondary | ICD-10-CM

## 2024-01-04 DIAGNOSIS — F331 Major depressive disorder, recurrent, moderate: Secondary | ICD-10-CM

## 2024-01-04 DIAGNOSIS — B20 Human immunodeficiency virus [HIV] disease: Secondary | ICD-10-CM

## 2024-01-04 MED ORDER — ARIPIPRAZOLE 10 MG PO TABS: 10.0000 mg | ORAL_TABLET | Freq: Every day | ORAL | 5 refills | Status: DC

## 2024-01-04 MED ORDER — VENLAFAXINE HCL ER 75 MG PO CP24
75.0000 mg | ORAL_CAPSULE | Freq: Every day | ORAL | 5 refills | Status: DC
Start: 2024-01-04 — End: 2024-06-16

## 2024-01-04 MED ORDER — VENLAFAXINE HCL ER 150 MG PO CP24
150.0000 mg | ORAL_CAPSULE | Freq: Every day | ORAL | 5 refills | Status: DC
Start: 2024-01-04 — End: 2024-06-16

## 2024-01-04 MED ORDER — BIKTARVY 50-200-25 MG PO TABS
1.0000 | ORAL_TABLET | Freq: Every day | ORAL | 0 refills | Status: DC
Start: 2024-01-04 — End: 2024-02-09

## 2024-01-04 MED ORDER — SILDENAFIL CITRATE 50 MG PO TABS
50.0000 mg | ORAL_TABLET | Freq: Every day | ORAL | 0 refills | Status: DC | PRN
Start: 2024-01-04 — End: 2024-06-16

## 2024-01-04 MED ORDER — ATORVASTATIN CALCIUM 10 MG PO TABS
10.0000 mg | ORAL_TABLET | Freq: Every day | ORAL | 5 refills | Status: DC
Start: 2024-01-04 — End: 2024-06-16

## 2024-01-04 NOTE — Patient Instructions (Signed)
 Nice to see you. ? ?We will check your lab work today. ? ?Continue to take your medication daily as prescribed. ? ?Refills have been sent to the pharmacy. ? ?Plan for follow up in 6 months or sooner if needed with lab work on the same day. ? ?Have a great day and stay safe! ? ?

## 2024-01-04 NOTE — Progress Notes (Signed)
 Brief Narrative   Patient ID: Gary Lindsey, male    DOB: March 09, 1971, 53 y.o.   MRN: [id number]  Gary Lindsey is a 53 y/o AA male diagnosed with HIV in 1996 with risk factor of IVDU. Initial viral load was 3.5 million with CD4 count 42. No genosure available. Entered care at [facility] Stage 3. History of thrush. ART experienced with darunivir/ritonivir/truvada; Kaletra, Symtuza and Biktarvy.   Subjective:    Chief Complaint  Patient presents with   Follow-up    Increased Effexor dose from 150 mg to 225 mg   HIV Positive/AIDS    HPI:  Gary Lindsey is a 53 y.o. male with HIV disease last seen on 07/24/23 with well controlled virus and good adherence and tolerance to Biktarvy. Viral load was undetectable with CD4 count 887. Viral load was undetectable and CD4 count 887. Mental health improved with Effexor and Abilify and noted mood more stable and feeling good.  Kidney function, liver function, electrolytes within normal ranges.  Here today for routine follow-up.  Gary Lindsey has been doing well since his last office visit and continues to take [facility] as prescribed with no adverse side effects or problems obtaining medication from the pharmacy.  Has increased his Effexor to 225 mg and has been splitting his extended release tablets.  Has noted improvements and stability in his mood with no adverse side effects.  Recently started a new job in [employer].  Back pain is improved.  Housing and access to food are stable.  Transportation is via vehicle.  Condoms and site-specific STD testing offered.  Healthcare maintenance reviewed.  Due for routine dental care.  Denies fevers, chills, night sweats, headaches, changes in vision, neck pain/stiffness, nausea, diarrhea, vomiting, lesions or rashes.  Lab Results  Component Value Date   CD4TCELL 49 06/23/2023   CD4TABS 887 11/05/2022   Lab Results  Component Value Date   HIV1RNAQUANT Not Detected 06/23/2023     No Known Allergies    Outpatient Medications  Prior to Visit  Medication Sig Dispense Refill   ARIPiprazole (ABILIFY) 10 MG tablet Take 1 tablet (10 mg total) by mouth daily. 30 tablet 5   atorvastatin (LIPITOR) 10 MG tablet Take 1 tablet (10 mg total) by mouth daily. 30 tablet 5   BIKTARVY 50-200-25 MG TABS tablet TAKE 1 TABLET BY MOUTH DAILY 30 tablet 0   venlafaxine XR (EFFEXOR-XR) 150 MG 24 hr capsule Take 1 capsule (150 mg total) by mouth daily with breakfast. (Patient taking differently: Take 150 mg by mouth daily with breakfast. Taking 225mg ) 30 capsule 5   No facility-administered medications prior to visit.     Past Medical History:  Diagnosis Date   Anxiety    Basal cell carcinoma of back    Chest pain    Closed left ankle fracture 06-12-2014   plating   DEPRESSION    HERPES SIMPLEX INFECTION    HIV infection (HCC)    INSOMNIA    Urethral discharge      History reviewed. No pertinent surgical history.    Review of Systems  Constitutional:  Negative for appetite change, chills, fatigue, fever and unexpected weight change.  Eyes:  Negative for visual disturbance.  Respiratory:  Negative for cough, chest tightness, shortness of breath and wheezing.   Cardiovascular:  Negative for chest pain and leg swelling.  Gastrointestinal:  Negative for abdominal pain, constipation, diarrhea, nausea and vomiting.  Genitourinary:  Negative for dysuria, flank pain, frequency, genital sores, hematuria and urgency.  Skin:  Negative for rash.  Allergic/Immunologic: Negative for immunocompromised state.  Neurological:  Negative for dizziness and headaches.      Objective:    BP (!) 144/89   Pulse 73   Temp 99.6 F (37.6 C) (Temporal)   Ht 5\' 8"  (1.727 m)   Wt 215 lb (97.5 kg)   SpO2 94%   BMI 32.69 kg/m  Nursing note and vital signs reviewed.  Physical Exam Constitutional:      General: He is not in acute distress.    Appearance: He is well-developed.  Eyes:     Conjunctiva/sclera: Conjunctivae normal.   Cardiovascular:     Rate and Rhythm: Normal rate and regular rhythm.     Heart sounds: Normal heart sounds. No murmur heard.    No friction rub. No gallop.  Pulmonary:     Effort: Pulmonary effort is normal. No respiratory distress.     Breath sounds: Normal breath sounds. No wheezing or rales.  Chest:     Chest wall: No tenderness.  Abdominal:     General: Bowel sounds are normal.     Palpations: Abdomen is soft.     Tenderness: There is no abdominal tenderness.  Musculoskeletal:     Cervical back: Neck supple.  Lymphadenopathy:     Cervical: No cervical adenopathy.  Skin:    General: Skin is warm and dry.     Findings: No rash.  Neurological:     Mental Status: He is alert and oriented to person, place, and time.  Psychiatric:        Behavior: Behavior normal.        Thought Content: Thought content normal.        Judgment: Judgment normal.         01/04/2024    8:28 AM 07/24/2023    9:12 AM 11/05/2022    8:47 AM 11/28/2021    2:07 PM 11/18/2017    8:40 AM  Depression screen PHQ 2/9  Decreased Interest 0 0 0 0 0  Down, Depressed, Hopeless 0 0 0 0 0  PHQ - 2 Score 0 0 0 0 0       Assessment & Plan:    Patient Active Problem List   Diagnosis Date Noted   Mood disorder (HCC) 06/24/2023   Healthcare maintenance 11/05/2022   Hyperlipidemia 11/28/2021   Hepatitis B immune 01/28/2017   Closed left ankle fracture 06/12/2014   Anal warts 10/28/2012   Anxiety 05/17/2012   Chest pain 05/17/2012   Basal cell carcinoma of back 03/12/2011   Human immunodeficiency virus (HIV) disease (HCC) 09/17/2010   Herpes simplex virus (HSV) infection 09/17/2010   Depression 09/17/2010   INSOMNIA 09/17/2010   URETHRAL DISCHARGE 09/17/2010     Problem List Items Addressed This Visit       Other   Human immunodeficiency virus (HIV) disease (HCC)   Gary Lindsey continues to have well-controlled virus with good adherence and tolerance to [employer].  Reviewed previous lab work and  discussed plan of care and U equals U.  No problems obtaining medication from the pharmacy and is covered by RW/UMAP.  Social determinants of health reviewed with no intervention indicated.  Continue current dose of Biktarvy.  Check blood work.  Plan for follow-up in 6 months or sooner if needed with lab work on the same day.      Relevant Medications   BIKTARVY 50-200-25 MG TABS tablet   Other Relevant Orders   COMPLETE METABOLIC PANEL WITH GFR  T-helper cells (CD4) count (not at [facility])   HIV-1 RNA quant-no reflex-bld   Depression   Relevant Medications   venlafaxine XR (EFFEXOR-XR) 75 MG 24 hr capsule   venlafaxine XR (EFFEXOR-XR) 150 MG 24 hr capsule   Hyperlipidemia   Gary Lindsey is tolerating atorvastatin with no adverse side effects or myalgias.  Helping with his lipid profile as well as inflammation associated with HIV.  Continue current dose of atorvastatin.      Relevant Medications   atorvastatin (LIPITOR) 10 MG tablet   sildenafil (VIAGRA) 50 MG tablet   Healthcare maintenance - Primary   Discussed importance of safe sexual practice and condom use. Condoms and site specific STD testing offered.  Vaccinations reviewed and declined.  Due for Menveo and influenza. Due for routine dental care which he will schedule independently and declines referral to [facility]. Due for colon cancer screening and declines referral to gastroenterology or Cologuard.      Mood disorder [facility])   Gary Lindsey mood has been stable with current dose of Effexor and Abilify and he self increased to 225 mg Effexor with no adverse side effects.  Discussed importance of taking medications as prescribed and not splitting extended release tablets as the pharmacodynamics are different with extended release.  Will increase Effexor to 225 mg and continue current dose of Abilify.  Counseling discussed.  Denies suicidal ideations or signs of psychosis.      Relevant Medications   ARIPiprazole (ABILIFY) 10 MG tablet      I am having Gary Lindsey start on venlafaxine XR and sildenafil. I am also having him maintain his venlafaxine XR, Biktarvy, atorvastatin, and ARIPiprazole.   Meds ordered this encounter  Medications   venlafaxine XR (EFFEXOR-XR) 75 MG 24 hr capsule    Sig: Take 1 capsule (75 mg total) by mouth daily with breakfast.    Dispense:  30 capsule    Refill:  5    Supervising Provider:   Judyann Munson [[id number]]   venlafaxine XR (EFFEXOR-XR) 150 MG 24 hr capsule    Sig: Take 1 capsule (150 mg total) by mouth daily with breakfast.    Dispense:  30 capsule    Refill:  5    Supervising Provider:   Drue Second, CYNTHIA [[id number]]   BIKTARVY 50-200-25 MG TABS tablet    Sig: Take 1 tablet by mouth daily.    Dispense:  30 tablet    Refill:  0    Supervising Provider:   Judyann Munson [phone]    Prescription Type::   Renewal   atorvastatin (LIPITOR) 10 MG tablet    Sig: Take 1 tablet (10 mg total) by mouth daily.    Dispense:  30 tablet    Refill:  5    Supervising Provider:   Judyann Munson [[id number]]   sildenafil (VIAGRA) 50 MG tablet    Sig: Take 1 tablet (50 mg total) by mouth daily as needed for erectile dysfunction.    Dispense:  30 tablet    Refill:  0    Supervising Provider:   Judyann Munson [[id number]]   ARIPiprazole (ABILIFY) 10 MG tablet    Sig: Take 1 tablet (10 mg total) by mouth daily.    Dispense:  30 tablet    Refill:  5    Supervising Provider:   Judyann Munson [[id number]]     Follow-up: Return in about 6 months (around 07/03/2024). or sooner if needed.    Marcos Eke, MSN, FNP-C Nurse Practitioner [facility]  [facility] Main number: [phone]

## 2024-01-04 NOTE — Assessment & Plan Note (Signed)
 Mr. Mckillop continues to have well-controlled virus with good adherence and tolerance to [employer].  Reviewed previous lab work and discussed plan of care and U equals U.  No problems obtaining medication from the pharmacy and is covered by RW/[facility]AP.  Social determinants of health reviewed with no intervention indicated.  Continue current dose of Biktarvy.  Check blood work.  Plan for follow-up in 6 months or sooner if needed with lab work on the same day.

## 2024-01-04 NOTE — Assessment & Plan Note (Signed)
 Mr. Ulbrich is tolerating atorvastatin with no adverse side effects or myalgias.  Helping with his lipid profile as well as inflammation associated with HIV.  Continue current dose of atorvastatin.

## 2024-01-04 NOTE — Assessment & Plan Note (Signed)
 Gary Lindsey mood has been stable with current dose of Effexor and Abilify and he self increased to 225 mg Effexor with no adverse side effects.  Discussed importance of taking medications as prescribed and not splitting extended release tablets as the pharmacodynamics are different with extended release.  Will increase Effexor to 225 mg and continue current dose of Abilify.  Counseling discussed.  Denies suicidal ideations or signs of psychosis.

## 2024-01-04 NOTE — Assessment & Plan Note (Signed)
 Discussed importance of safe sexual practice and condom use. Condoms and site specific STD testing offered.  Vaccinations reviewed and declined.  Due for Menveo and influenza. Due for routine dental care which he will schedule independently and declines referral to [facility]. Due for colon cancer screening and declines referral to gastroenterology or Cologuard.

## 2024-01-07 LAB — COMPLETE METABOLIC PANEL WITH GFR
AG Ratio: 1.4 (calc) (ref 1.0–2.5)
ALT: 14 U/L (ref 9–46)
AST: 14 U/L (ref 10–35)
Albumin: 4.4 g/dL (ref 3.6–5.1)
Alkaline phosphatase (APISO): 72 U/L (ref 35–144)
BUN: 13 mg/dL (ref 7–25)
CO2: 26 mmol/L (ref 20–32)
Calcium: 9.3 mg/dL (ref 8.6–10.3)
Chloride: 105 mmol/L (ref 98–110)
Creat: 0.95 mg/dL (ref 0.70–1.30)
Globulin: 3.1 g/dL (ref 1.9–3.7)
Glucose, Bld: 96 mg/dL (ref 65–99)
Potassium: 4.4 mmol/L (ref 3.5–5.3)
Sodium: 140 mmol/L (ref 135–146)
Total Bilirubin: 0.5 mg/dL (ref 0.2–1.2)
Total Protein: 7.5 g/dL (ref 6.1–8.1)
eGFR: 96 mL/min/{1.73_m2} (ref >=60)

## 2024-01-07 LAB — T-HELPER CELLS (CD4) COUNT (NOT AT ARMC)
Absolute CD4: 752 {cells}/uL (ref 490–1740)
CD4 T Helper %: 47 % (ref 30–61)
Total lymphocyte count: 1585 {cells}/uL (ref 850–3900)

## 2024-01-07 LAB — HIV-1 RNA QUANT-NO REFLEX-BLD
HIV 1 RNA Quant: 25 {copies}/mL — ABNORMAL HIGH
HIV-1 RNA Quant, Log: 1.4 {Log} — ABNORMAL HIGH

## 2024-01-11 ENCOUNTER — Other Ambulatory Visit (HOSPITAL_COMMUNITY): Payer: Self-pay

## 2024-01-25 ENCOUNTER — Telehealth: Payer: Self-pay

## 2024-01-25 NOTE — Telephone Encounter (Signed)
 Spoke to patient regarding Prior Auth for Effexor 75 mg. He states he is going to get future refills from [facility] in order for it to be covered. He paid for this medication this time and will not need a Prior Auth at this time.

## 2024-02-09 ENCOUNTER — Other Ambulatory Visit: Payer: Self-pay

## 2024-02-09 DIAGNOSIS — B20 Human immunodeficiency virus [HIV] disease: Secondary | ICD-10-CM

## 2024-02-09 MED ORDER — BIKTARVY 50-200-25 MG PO TABS: 1.0000 | ORAL_TABLET | Freq: Every day | ORAL | 5 refills | Status: DC

## 2024-03-28 DIAGNOSIS — F39 Unspecified mood [affective] disorder: Secondary | ICD-10-CM

## 2024-03-28 MED ORDER — ARIPIPRAZOLE 10 MG PO TABS: 10.0000 mg | ORAL_TABLET | Freq: Every day | ORAL | 1 refills | Status: DC

## 2024-04-01 NOTE — Progress Notes (Signed)
 The 10-year ASCVD risk score (Arnett DK, et al., 2019) is: 5.9%   Values used to calculate the score:     Age: 53 years     Sex: Male     Is Non-Hispanic African American: No     Diabetic: No     Tobacco smoker: No     Systolic Blood Pressure: 144 mmHg     Is BP treated: No     HDL Cholesterol: 42 mg/dL     Total Cholesterol: 194 mg/dL  Currently prescribed atorvastatin  10 mg.  Lois Ostrom, BSN, RN

## 2024-06-16 ENCOUNTER — Other Ambulatory Visit: Payer: Self-pay

## 2024-06-16 ENCOUNTER — Ambulatory Visit: Payer: Self-pay | Admitting: Family

## 2024-06-16 ENCOUNTER — Ambulatory Visit: Payer: Self-pay

## 2024-06-16 ENCOUNTER — Encounter: Payer: Self-pay | Admitting: Family

## 2024-06-16 VITALS — BP 117/81 | HR 72 | Temp 97.5°F | Ht 68.0 in | Wt 198.0 lb

## 2024-06-16 DIAGNOSIS — Z0000 Encounter for general adult medical examination without abnormal findings: Secondary | ICD-10-CM

## 2024-06-16 DIAGNOSIS — F331 Major depressive disorder, recurrent, moderate: Secondary | ICD-10-CM

## 2024-06-16 DIAGNOSIS — B20 Human immunodeficiency virus [HIV] disease: Secondary | ICD-10-CM

## 2024-06-16 DIAGNOSIS — F32A Depression, unspecified: Secondary | ICD-10-CM

## 2024-06-16 DIAGNOSIS — F39 Unspecified mood [affective] disorder: Secondary | ICD-10-CM

## 2024-06-16 DIAGNOSIS — E785 Hyperlipidemia, unspecified: Secondary | ICD-10-CM

## 2024-06-16 DIAGNOSIS — Z79899 Other long term (current) drug therapy: Secondary | ICD-10-CM

## 2024-06-16 MED ORDER — ATORVASTATIN CALCIUM 10 MG PO TABS: 10.0000 mg | ORAL_TABLET | Freq: Every day | ORAL | 5 refills | Status: DC

## 2024-06-16 MED ORDER — VENLAFAXINE HCL ER 75 MG PO CP24
75.0000 mg | ORAL_CAPSULE | Freq: Every day | ORAL | 5 refills | Status: AC
Start: 2024-06-16 — End: ?

## 2024-06-16 MED ORDER — VENLAFAXINE HCL ER 150 MG PO CP24: 150.0000 mg | ORAL_CAPSULE | Freq: Every day | ORAL | 5 refills | Status: AC

## 2024-06-16 MED ORDER — BIKTARVY 50-200-25 MG PO TABS
1.0000 | ORAL_TABLET | Freq: Every day | ORAL | 5 refills | Status: AC
Start: 2024-06-16 — End: ?

## 2024-06-16 MED ORDER — ARIPIPRAZOLE 15 MG PO TABS: 15.0000 mg | ORAL_TABLET | Freq: Every day | ORAL | 1 refills | Status: AC

## 2024-06-16 NOTE — Assessment & Plan Note (Signed)
 Mr. Gary Lindsey mood has been stable and has self increased his aripiprazole  to 15 mg daily with continued venlafaxine  at 225 mg.  Clinically this is the best he is felt and has been able to keep a job which she has not been able to do in a significant period of time.  No suicidal ideations or signs of psychosis.  Continue current dose of venlafaxine  at 225 mg and increase aripiprazole  to 15 mg.  Discussed importance of not stopping medications abruptly secondary to possible withdrawal effects.

## 2024-06-16 NOTE — Patient Instructions (Addendum)
 Nice to see you. ? ?We will check your lab work today. ? ?Continue to take your medication daily as prescribed. ? ?Refills have been sent to the pharmacy. ? ?Plan for follow up in 6 months or sooner if needed with lab work on the same day. ? ?Have a great day and stay safe! ? ?

## 2024-06-16 NOTE — Progress Notes (Signed)
 Brief Narrative   Patient ID: Gary Lindsey, male    DOB: Aug 14, 1971, 53 y.o.   MRN: [id number]  Gary Lindsey is a 53 y/o AA male diagnosed with HIV in 1996 with risk factor of IVDU. Initial viral load was 3.5 million with CD4 count 42. No genosure available. Entered care at [facility] Stage 3. History of thrush. ART experienced with darunivir/ritonivir/truvada; Kaletra, Symtuza and Biktarvy .   Subjective:   Chief Complaint  Patient presents with   Follow-up    HPI:  Gary Lindsey is a 53 y.o. male with HIV disease last seen on 12/31/2023 with well-controlled virus and good adherence and tolerance to Biktarvy .  Viral load was undetectable with a CD4 count 752.  Kidney function, liver function, electrolytes within normal ranges.  Mood has been stabilized and improved with venlafaxine  and aripiprazole .  Here today for routine follow-up.  Mr. Rosamond has been doing well since his last office visit and continues to take Biktarvy  as prescribed with no adverse side effects or problems obtaining medication from the pharmacy.  He is also taking venlafaxine  and aripiprazole  noting that he has increased his dose to 15 mg daily.  Mood has been stable and has been able to hold down a job which she has not been able to do in the past and feels the best he has felt in a long time.  No new concerns/complaints.  Housing, transportation, and access to food are stable.  Currently sexually active with condoms and site-specific STD testing offered.  Healthcare maintenance reviewed.  Due for routine dental care and colonoscopy for colon cancer screening.   Denies fevers, chills, night sweats, headaches, changes in vision, neck pain/stiffness, nausea, diarrhea, vomiting, lesions or rashes.  Lab Results  Component Value Date   CD4TCELL 47 01/04/2024   CD4TABS 887 11/05/2022   Lab Results  Component Value Date   HIV1RNAQUANT 25 (H) 01/04/2024     No Known Allergies    Outpatient Medications Prior to Visit   Medication Sig Dispense Refill   ARIPiprazole  (ABILIFY ) 10 MG tablet Take 1 tablet (10 mg total) by mouth daily. 90 tablet 1   atorvastatin  (LIPITOR) 10 MG tablet Take 1 tablet (10 mg total) by mouth daily. 30 tablet 5   BIKTARVY  50-200-25 MG TABS tablet Take 1 tablet by mouth daily. 30 tablet 5   venlafaxine  XR (EFFEXOR -XR) 150 MG 24 hr capsule Take 1 capsule (150 mg total) by mouth daily with breakfast. 30 capsule 5   venlafaxine  XR (EFFEXOR -XR) 75 MG 24 hr capsule Take 1 capsule (75 mg total) by mouth daily with breakfast. 30 capsule 5   sildenafil  (VIAGRA ) 50 MG tablet Take 1 tablet (50 mg total) by mouth daily as needed for erectile dysfunction. 30 tablet 0   No facility-administered medications prior to visit.     Past Medical History:  Diagnosis Date   Anxiety    Basal cell carcinoma of back    Chest pain    Closed left ankle fracture 06-12-2014   plating   DEPRESSION    HERPES SIMPLEX INFECTION    HIV infection (HCC)    INSOMNIA    Urethral discharge      History reviewed. No pertinent surgical history.      Review of Systems  Constitutional:  Negative for appetite change, chills, fatigue, fever and unexpected weight change.  Eyes:  Negative for visual disturbance.  Respiratory:  Negative for cough, chest tightness, shortness of breath and wheezing.   Cardiovascular:  Negative  for chest pain and leg swelling.  Gastrointestinal:  Negative for abdominal pain, constipation, diarrhea, nausea and vomiting.  Genitourinary:  Negative for dysuria, flank pain, frequency, genital sores, hematuria and urgency.  Skin:  Negative for rash.  Allergic/Immunologic: Negative for immunocompromised state.  Neurological:  Negative for dizziness and headaches.     Objective:   BP 117/81   Pulse 72   Temp (!) 97.5 F (36.4 C) (Temporal)   Ht 5' 8 (1.727 m)   Wt 198 lb (89.8 kg)   SpO2 96%   BMI 30.11 kg/m  Nursing note and vital signs reviewed.  Physical  Exam Constitutional:      General: He is not in acute distress.    Appearance: He is well-developed.  Eyes:     Conjunctiva/sclera: Conjunctivae normal.  Cardiovascular:     Rate and Rhythm: Normal rate and regular rhythm.     Heart sounds: Normal heart sounds. No murmur heard.    No friction rub. No gallop.  Pulmonary:     Effort: Pulmonary effort is normal. No respiratory distress.     Breath sounds: Normal breath sounds. No wheezing or rales.  Chest:     Chest wall: No tenderness.  Abdominal:     General: Bowel sounds are normal.     Palpations: Abdomen is soft.     Tenderness: There is no abdominal tenderness.  Musculoskeletal:     Cervical back: Neck supple.  Lymphadenopathy:     Cervical: No cervical adenopathy.  Skin:    General: Skin is warm and dry.     Findings: No rash.  Neurological:     Mental Status: He is alert and oriented to person, place, and time.  Psychiatric:        Mood and Affect: Mood normal.          06/16/2024    8:52 AM 01/04/2024    8:28 AM 07/24/2023    9:12 AM 11/05/2022    8:47 AM 11/28/2021    2:07 PM  Depression screen PHQ 2/9  Decreased Interest 1 0 0 0 0  Down, Depressed, Hopeless 1 0 0 0 0  PHQ - 2 Score 2 0 0 0 0  Altered sleeping 0      Tired, decreased energy 1      Change in appetite 0      Feeling bad or failure about yourself  0      Trouble concentrating 0      Moving slowly or fidgety/restless 1      Suicidal thoughts 0      PHQ-9 Score 4      Difficult doing work/chores Somewhat difficult            06/16/2024    8:53 AM  GAD 7 : Generalized Anxiety Score  Nervous, Anxious, on Edge 1  Control/stop worrying 1  Worry too much - different things 1  Trouble relaxing 1  Restless 1  Easily annoyed or irritable 1  Afraid - awful might happen 1  Total GAD 7 Score 7  Anxiety Difficulty Somewhat difficult     The 10-year ASCVD risk score (Arnett DK, et al., 2019) is: 4.1%   Values used to calculate the score:      Age: 15 years     Clincally relevant sex: Male     Is Non-Hispanic African American: No     Diabetic: No     Tobacco smoker: No     Systolic Blood Pressure: 117  mmHg     Is BP treated: No     HDL Cholesterol: 42 mg/dL     Total Cholesterol: 194 mg/dL      Assessment & Plan:    Patient Active Problem List   Diagnosis Date Noted   Mood disorder (HCC) 06/24/2023   Healthcare maintenance 11/05/2022   Hyperlipidemia 11/28/2021   Hepatitis B immune 01/28/2017   Closed left ankle fracture 06/12/2014   Anal warts 10/28/2012   Anxiety 05/17/2012   Chest pain 05/17/2012   Basal cell carcinoma of back 03/12/2011   Human immunodeficiency virus (HIV) disease (HCC) 09/17/2010   Herpes simplex virus (HSV) infection 09/17/2010   Depression 09/17/2010   INSOMNIA 09/17/2010   URETHRAL DISCHARGE 09/17/2010     Problem List Items Addressed This Visit       Other   Human immunodeficiency virus (HIV) disease (HCC) - Primary   Mr. Schall continues to have well-controlled virus with good adherence and tolerance to Biktarvy .  Reviewed lab work and discussed plan of care and U equals U.  Social determinants of health reviewed with no interventions indicated.  Covered by [employer].  Check blood work.  Continue current dose of Biktarvy .  Plan for follow-up in 6 months or sooner if needed with lab work on the same day.      Relevant Medications   BIKTARVY  50-200-25 MG TABS tablet   Other Relevant Orders   Comprehensive metabolic panel with GFR   HIV-1 RNA quant-no reflex-bld   T-helper cell (CD4)- ([facility] clinic only)   CBC   Depression   Relevant Medications   venlafaxine  XR (EFFEXOR -XR) 150 MG 24 hr capsule   venlafaxine  XR (EFFEXOR -XR) 75 MG 24 hr capsule   Hyperlipidemia   Continues on atorvastatin  with no adverse side effects or myalgias. Continue current dose of atorvastatin  to reduce cardiovascular disease and HIV associated inflammation.       Relevant Medications   atorvastatin  (LIPITOR)  10 MG tablet   Healthcare maintenance   Discussed importance of safe sexual practice and condom use. Condoms and site specific STD testing offered.  Vaccinations reviewed and deferred.  Due for routine dental care with referral to [facility] declined.  Due for colon cancer screening which he will consider.       Mood disorder [facility])   Mr. Kosanke's mood has been stable and has self increased his aripiprazole  to 15 mg daily with continued venlafaxine  at 225 mg.  Clinically this is the best he is felt and has been able to keep a job which she has not been able to do in a significant period of time.  No suicidal ideations or signs of psychosis.  Continue current dose of venlafaxine  at 225 mg and increase aripiprazole  to 15 mg.  Discussed importance of not stopping medications abruptly secondary to possible withdrawal effects.      Relevant Medications   ARIPiprazole  (ABILIFY ) 15 MG tablet   Other Visit Diagnoses       Pharmacologic therapy       Relevant Orders   Lipid panel        I have changed Quamel Mcgirr's ARIPiprazole . I am also having him maintain his sildenafil , venlafaxine  XR, venlafaxine  XR, Biktarvy , and atorvastatin .   Meds ordered this encounter  Medications   venlafaxine  XR (EFFEXOR -XR) 150 MG 24 hr capsule    Sig: Take 1 capsule (150 mg total) by mouth daily with breakfast.    Dispense:  30 capsule    Refill:  5  Supervising Provider:   SNIDER, CYNTHIA [[id number]]   venlafaxine  XR (EFFEXOR -XR) 75 MG 24 hr capsule    Sig: Take 1 capsule (75 mg total) by mouth daily with breakfast.    Dispense:  30 capsule    Refill:  5    Supervising Provider:   SNIDER, CYNTHIA [[id number]]   BIKTARVY  50-200-25 MG TABS tablet    Sig: Take 1 tablet by mouth daily.    Dispense:  30 tablet    Refill:  5    Supervising Provider:   SNIDER, CYNTHIA [phone]    Prescription Type::   Renewal   atorvastatin  (LIPITOR) 10 MG tablet    Sig: Take 1 tablet (10 mg total) by mouth daily.    Dispense:  30 tablet     Refill:  5    Supervising Provider:   SNIDER, CYNTHIA [[id number]]   ARIPiprazole  (ABILIFY ) 15 MG tablet    Sig: Take 1 tablet (15 mg total) by mouth daily.    Dispense:  90 tablet    Refill:  1    Supervising Provider:   LUIZ CHANNEL [[id number]]     Follow-up: Return in about 6 months (around 12/17/2024). or sooner if needed.    Cathlyn July, MSN, FNP-C Nurse Practitioner [facility] Main number: [phone]

## 2024-06-16 NOTE — Assessment & Plan Note (Signed)
 Gary Lindsey continues to have well-controlled virus with good adherence and tolerance to Biktarvy .  Reviewed lab work and discussed plan of care and U equals U.  Social determinants of health reviewed with no interventions indicated.  Covered by [employer].  Check blood work.  Continue current dose of Biktarvy .  Plan for follow-up in 6 months or sooner if needed with lab work on the same day.

## 2024-06-16 NOTE — Assessment & Plan Note (Signed)
 Discussed importance of safe sexual practice and condom use. Condoms and site specific STD testing offered.  Vaccinations reviewed and deferred.  Due for routine dental care with referral to [facility] declined.  Due for colon cancer screening which he will consider.

## 2024-06-16 NOTE — Assessment & Plan Note (Signed)
 Continues on atorvastatin  with no adverse side effects or myalgias. Continue current dose of atorvastatin  to reduce cardiovascular disease and HIV associated inflammation.

## 2024-06-17 LAB — T-HELPER CELL (CD4) - (RCID CLINIC ONLY)
CD4 % Helper T Cell: 49 % (ref 33–65)
CD4 T Cell Abs: 690 /uL (ref 400–1790)

## 2024-06-19 LAB — COMPREHENSIVE METABOLIC PANEL WITH GFR
AG Ratio: 1.6 (calc) (ref 1.0–2.5)
ALT: 21 U/L (ref 9–46)
AST: 20 U/L (ref 10–35)
Albumin: 4.7 g/dL (ref 3.6–5.1)
Alkaline phosphatase (APISO): 77 U/L (ref 35–144)
BUN: 13 mg/dL (ref 7–25)
CO2: 28 mmol/L (ref 20–32)
Calcium: 9.4 mg/dL (ref 8.6–10.3)
Chloride: 102 mmol/L (ref 98–110)
Creat: 1.01 mg/dL (ref 0.70–1.30)
Globulin: 3 g/dL (ref 1.9–3.7)
Glucose, Bld: 122 mg/dL — ABNORMAL HIGH (ref 65–99)
Potassium: 4.3 mmol/L (ref 3.5–5.3)
Sodium: 138 mmol/L (ref 135–146)
Total Bilirubin: 0.5 mg/dL (ref 0.2–1.2)
Total Protein: 7.7 g/dL (ref 6.1–8.1)
eGFR: 89 mL/min/1.73m2 (ref >=60)

## 2024-06-19 LAB — CBC
HCT: 44.9 % (ref 38.5–50.0)
Hemoglobin: 14.5 g/dL (ref 13.2–17.1)
MCH: 31.2 pg (ref 27.0–33.0)
MCHC: 32.3 g/dL (ref 32.0–36.0)
MCV: 96.6 fL (ref 80.0–100.0)
MPV: 10.5 fL (ref 7.5–12.5)
Platelets: 260 Thousand/uL (ref 140–400)
RBC: 4.65 Million/uL (ref 4.20–5.80)
RDW: 13.2 % (ref 11.0–15.0)
WBC: 7.6 Thousand/uL (ref 3.8–10.8)

## 2024-06-19 LAB — LIPID PANEL
Cholesterol: 232 mg/dL — ABNORMAL HIGH (ref <200)
HDL: 45 mg/dL (ref >=40)
LDL Cholesterol (Calc): 145 mg/dL — ABNORMAL HIGH
Non-HDL Cholesterol (Calc): 187 mg/dL — ABNORMAL HIGH (ref <130)
Total CHOL/HDL Ratio: 5.2 (calc) — ABNORMAL HIGH (ref <5.0)
Triglycerides: 297 mg/dL — ABNORMAL HIGH (ref <150)

## 2024-06-19 LAB — HIV-1 RNA QUANT-NO REFLEX-BLD
HIV 1 RNA Quant: NOT DETECTED {copies}/mL
HIV-1 RNA Quant, Log: NOT DETECTED {Log_copies}/mL

## 2024-06-20 ENCOUNTER — Ambulatory Visit: Payer: Self-pay | Admitting: Family

## 2024-06-20 MED ORDER — ATORVASTATIN CALCIUM 20 MG PO TABS: 20.0000 mg | ORAL_TABLET | Freq: Every day | ORAL | 4 refills | Status: AC
